# Patient Record
Sex: Female | Born: 1970 | Race: White | Hispanic: No | Marital: Married | Smoking: Never smoker
Health system: Southern US, Community
[De-identification: ages and names within clinical notes are randomized; demographics above are authoritative.]

## PROBLEM LIST (undated history)

## (undated) DIAGNOSIS — C4359 Malignant melanoma of other part of trunk: Secondary | ICD-10-CM

## (undated) DIAGNOSIS — F4329 Adjustment disorder with other symptoms: Secondary | ICD-10-CM

## (undated) DIAGNOSIS — D649 Anemia, unspecified: Secondary | ICD-10-CM

## (undated) HISTORY — DX: Anemia, unspecified: D64.9

## (undated) HISTORY — DX: Adjustment disorder with other symptoms: F43.29

## (undated) HISTORY — DX: Malignant melanoma of other part of trunk: C43.59

---

## 1998-02-20 ENCOUNTER — Inpatient Hospital Stay (HOSPITAL_COMMUNITY): Admission: AD | Admit: 1998-02-20 | Discharge: 1998-02-20 | Payer: Self-pay | Admitting: Obstetrics and Gynecology

## 1998-04-01 ENCOUNTER — Inpatient Hospital Stay (HOSPITAL_COMMUNITY): Admission: AD | Admit: 1998-04-01 | Discharge: 1998-04-04 | Payer: Self-pay | Admitting: Obstetrics and Gynecology

## 1998-05-07 ENCOUNTER — Other Ambulatory Visit: Admission: RE | Admit: 1998-05-07 | Discharge: 1998-05-07 | Payer: Self-pay | Admitting: Obstetrics and Gynecology

## 1999-05-09 ENCOUNTER — Other Ambulatory Visit: Admission: RE | Admit: 1999-05-09 | Discharge: 1999-05-09 | Payer: Self-pay | Admitting: Obstetrics and Gynecology

## 1999-12-30 ENCOUNTER — Other Ambulatory Visit: Admission: RE | Admit: 1999-12-30 | Discharge: 1999-12-30 | Payer: Self-pay | Admitting: Obstetrics and Gynecology

## 2000-05-30 ENCOUNTER — Encounter: Payer: Self-pay | Admitting: Obstetrics and Gynecology

## 2000-05-30 ENCOUNTER — Observation Stay (HOSPITAL_COMMUNITY): Admission: AD | Admit: 2000-05-30 | Discharge: 2000-05-31 | Payer: Self-pay | Admitting: Obstetrics and Gynecology

## 2000-06-02 ENCOUNTER — Inpatient Hospital Stay (HOSPITAL_COMMUNITY): Admission: AD | Admit: 2000-06-02 | Discharge: 2000-06-02 | Payer: Self-pay | Admitting: Obstetrics and Gynecology

## 2000-07-21 ENCOUNTER — Inpatient Hospital Stay (HOSPITAL_COMMUNITY): Admission: AD | Admit: 2000-07-21 | Discharge: 2000-07-24 | Payer: Self-pay | Admitting: Obstetrics and Gynecology

## 2000-10-18 ENCOUNTER — Encounter: Admission: RE | Admit: 2000-10-18 | Discharge: 2001-01-16 | Payer: Self-pay | Admitting: Obstetrics and Gynecology

## 2001-01-18 ENCOUNTER — Encounter: Admission: RE | Admit: 2001-01-18 | Discharge: 2001-04-12 | Payer: Self-pay | Admitting: Obstetrics and Gynecology

## 2001-04-17 ENCOUNTER — Encounter: Admission: RE | Admit: 2001-04-17 | Discharge: 2001-05-17 | Payer: Self-pay | Admitting: Obstetrics and Gynecology

## 2001-05-18 ENCOUNTER — Encounter: Admission: RE | Admit: 2001-05-18 | Discharge: 2001-06-17 | Payer: Self-pay | Admitting: Obstetrics and Gynecology

## 2001-09-08 ENCOUNTER — Other Ambulatory Visit: Admission: RE | Admit: 2001-09-08 | Discharge: 2001-09-08 | Payer: Self-pay | Admitting: Obstetrics and Gynecology

## 2002-04-18 ENCOUNTER — Other Ambulatory Visit: Admission: RE | Admit: 2002-04-18 | Discharge: 2002-04-18 | Payer: Self-pay | Admitting: Obstetrics and Gynecology

## 2002-10-24 ENCOUNTER — Inpatient Hospital Stay (HOSPITAL_COMMUNITY): Admission: AD | Admit: 2002-10-24 | Discharge: 2002-10-24 | Payer: Self-pay | Admitting: Obstetrics and Gynecology

## 2002-11-08 ENCOUNTER — Inpatient Hospital Stay (HOSPITAL_COMMUNITY): Admission: AD | Admit: 2002-11-08 | Discharge: 2002-11-11 | Payer: Self-pay | Admitting: Obstetrics and Gynecology

## 2005-12-14 HISTORY — PX: AUGMENTATION MAMMAPLASTY: SUR837

## 2011-08-11 ENCOUNTER — Ambulatory Visit: Payer: Self-pay | Admitting: Family Medicine

## 2016-09-30 ENCOUNTER — Other Ambulatory Visit: Payer: Self-pay | Admitting: Obstetrics and Gynecology

## 2016-09-30 DIAGNOSIS — Z1231 Encounter for screening mammogram for malignant neoplasm of breast: Secondary | ICD-10-CM

## 2016-10-06 ENCOUNTER — Ambulatory Visit
Admission: RE | Admit: 2016-10-06 | Discharge: 2016-10-06 | Disposition: A | Discharge status: Home / Self Care | Payer: Self-pay | Source: Ambulatory Visit | Attending: Obstetrics and Gynecology | Admitting: Obstetrics and Gynecology

## 2016-10-06 ENCOUNTER — Other Ambulatory Visit: Payer: Self-pay | Admitting: Obstetrics and Gynecology

## 2016-10-06 DIAGNOSIS — Z1231 Encounter for screening mammogram for malignant neoplasm of breast: Secondary | ICD-10-CM

## 2016-10-06 DIAGNOSIS — R928 Other abnormal and inconclusive findings on diagnostic imaging of breast: Secondary | ICD-10-CM | POA: Insufficient documentation

## 2016-10-08 ENCOUNTER — Other Ambulatory Visit: Payer: Self-pay | Admitting: Obstetrics and Gynecology

## 2016-10-09 ENCOUNTER — Other Ambulatory Visit: Payer: Self-pay | Admitting: Obstetrics and Gynecology

## 2016-10-09 DIAGNOSIS — R928 Other abnormal and inconclusive findings on diagnostic imaging of breast: Secondary | ICD-10-CM

## 2016-12-22 DIAGNOSIS — C4359 Malignant melanoma of other part of trunk: Secondary | ICD-10-CM | POA: Insufficient documentation

## 2016-12-22 HISTORY — DX: Malignant melanoma of other part of trunk: C43.59

## 2017-04-07 ENCOUNTER — Other Ambulatory Visit: Payer: Self-pay | Admitting: Obstetrics and Gynecology

## 2017-04-07 DIAGNOSIS — N6320 Unspecified lump in the left breast, unspecified quadrant: Secondary | ICD-10-CM

## 2017-04-26 ENCOUNTER — Ambulatory Visit
Admission: RE | Admit: 2017-04-26 | Discharge: 2017-04-26 | Disposition: A | Discharge status: Home / Self Care | Payer: Self-pay | Source: Ambulatory Visit | Attending: Oncology | Admitting: Oncology

## 2017-04-26 ENCOUNTER — Ambulatory Visit: Payer: Self-pay | Attending: Oncology

## 2017-04-26 VITALS — BP 123/74 | HR 73 | Temp 98.0°F | Ht 66.0 in | Wt 149.0 lb

## 2017-04-26 DIAGNOSIS — N63 Unspecified lump in unspecified breast: Secondary | ICD-10-CM

## 2017-04-26 NOTE — Progress Notes (Signed)
Subjective:     Patient ID: Dellis Filbert, female   DOB: Jun 01, 1971, 46 y.o.   MRN: [id number]  HPI   Review of Systems     Objective:   Physical Exam  Pulmonary/Chest: Right breast exhibits no inverted nipple, no mass, no nipple discharge, no skin change and no tenderness. Left breast exhibits no inverted nipple, no mass, no nipple discharge, no skin change and no tenderness. Breasts are symmetrical.    retroareolar glandular tissue       Assessment:     46 year old patient presents for [place] clinic visit.  Patient screened, and meets BCCCP eligibility.  Patient does not have insurance, Medicare or Medicaid.  Handout given on Affordable Care Act.  Instructed patient on breast self-exam using teach back method. She had a Birads 0 mammogram in October 2017, but did not  Return for additional views.  States in April, just prior to her menstrual cycle she felt lumps underneath her left areola.  They went away, but it scared her. States her provider said it felt like fibroglandular tissue.  On CBE, palpated bilateral retroareolar fibroglandular tissue.  Patient has bilateral breast implants.    Plan:    Sent for [facility] left diagnostic mammogram with tomosynthesis, and left breast ultrasound.

## 2017-04-27 ENCOUNTER — Other Ambulatory Visit: Payer: Self-pay

## 2017-04-27 DIAGNOSIS — N63 Unspecified lump in unspecified breast: Secondary | ICD-10-CM

## 2017-04-28 ENCOUNTER — Other Ambulatory Visit: Payer: Self-pay

## 2017-04-28 DIAGNOSIS — N63 Unspecified lump in unspecified breast: Secondary | ICD-10-CM

## 2017-05-05 ENCOUNTER — Ambulatory Visit: Payer: Self-pay

## 2017-05-12 ENCOUNTER — Ambulatory Visit: Admission: RE | Admit: 2017-05-12 | Payer: Self-pay | Source: Ambulatory Visit

## 2017-05-12 ENCOUNTER — Ambulatory Visit: Payer: Self-pay | Attending: Oncology

## 2017-05-21 NOTE — Progress Notes (Signed)
Patient did not show up for breast biopsy.  Messages left x 2 on her phone.

## 2017-06-23 NOTE — Progress Notes (Signed)
Mailed certified letter requesting patient to call back to schedule breast biopsy.

## 2017-07-29 ENCOUNTER — Ambulatory Visit
Admission: RE | Admit: 2017-07-29 | Discharge: 2017-07-29 | Disposition: A | Discharge status: Home / Self Care | Payer: Self-pay | Source: Ambulatory Visit | Attending: Oncology | Admitting: Oncology

## 2017-07-29 ENCOUNTER — Other Ambulatory Visit: Payer: Self-pay

## 2017-07-29 ENCOUNTER — Other Ambulatory Visit: Payer: Self-pay | Admitting: *Deleted

## 2017-07-29 DIAGNOSIS — N63 Unspecified lump in unspecified breast: Secondary | ICD-10-CM

## 2017-07-29 HISTORY — PX: BREAST BIOPSY: SHX20

## 2017-07-30 LAB — SURGICAL PATHOLOGY

## 2017-09-01 ENCOUNTER — Other Ambulatory Visit: Payer: Self-pay

## 2017-09-01 DIAGNOSIS — N63 Unspecified lump in unspecified breast: Secondary | ICD-10-CM

## 2017-09-01 NOTE — Progress Notes (Unsigned)
Benign Biposy results of fibroadenoma; Scheduled for 6 month follow-up mammogram on 01/31/18 at 10:00 per radiologist instruction. Mailed appointment information to patient.  Copy to [facility].

## 2017-10-26 DIAGNOSIS — F4329 Adjustment disorder with other symptoms: Secondary | ICD-10-CM

## 2017-10-26 HISTORY — DX: Adjustment disorder with other symptoms: F43.29

## 2017-12-27 ENCOUNTER — Other Ambulatory Visit: Payer: Self-pay

## 2017-12-27 ENCOUNTER — Inpatient Hospital Stay
Admission: EM | Admit: 2017-12-27 | Discharge: 2017-12-30 | DRG: 744 | Disposition: A | Discharge status: Home / Self Care | Payer: BLUE CROSS/BLUE SHIELD | Attending: Obstetrics & Gynecology | Admitting: Obstetrics & Gynecology

## 2017-12-27 DIAGNOSIS — Z23 Encounter for immunization: Secondary | ICD-10-CM

## 2017-12-27 DIAGNOSIS — D696 Thrombocytopenia, unspecified: Secondary | ICD-10-CM | POA: Diagnosis present

## 2017-12-27 DIAGNOSIS — D649 Anemia, unspecified: Secondary | ICD-10-CM | POA: Diagnosis present

## 2017-12-27 DIAGNOSIS — N939 Abnormal uterine and vaginal bleeding, unspecified: Secondary | ICD-10-CM

## 2017-12-27 DIAGNOSIS — D62 Acute posthemorrhagic anemia: Secondary | ICD-10-CM | POA: Diagnosis present

## 2017-12-27 DIAGNOSIS — N920 Excessive and frequent menstruation with regular cycle: Secondary | ICD-10-CM | POA: Diagnosis not present

## 2017-12-27 HISTORY — DX: Anemia, unspecified: D64.9

## 2017-12-27 LAB — BASIC METABOLIC PANEL
Anion gap: 9 (ref 5–15)
BUN: 12 mg/dL (ref 6–20)
CALCIUM: 8.2 mg/dL — AB (ref 8.9–10.3)
CO2: 22 mmol/L (ref 22–32)
Chloride: 105 mmol/L (ref 101–111)
Creatinine, Ser: 0.76 mg/dL (ref 0.44–1.00)
GFR calc Af Amer: >60 mL/min (ref >60)
GLUCOSE: 142 mg/dL — AB (ref 65–99)
Potassium: 3.8 mmol/L (ref 3.5–5.1)
Sodium: 136 mmol/L (ref 135–145)

## 2017-12-27 LAB — CBC
HCT: 18.5 % — ABNORMAL LOW (ref 35.0–47.0)
Hemoglobin: 6.2 g/dL — ABNORMAL LOW (ref 12.0–16.0)
MCH: 30.3 pg (ref 26.0–34.0)
MCHC: 33.3 g/dL (ref 32.0–36.0)
MCV: 91.1 fL (ref 80.0–100.0)
PLATELETS: 311 10*3/uL (ref 150–440)
RBC: 2.03 MIL/uL — ABNORMAL LOW (ref 3.80–5.20)
RDW: 12.5 % (ref 11.5–14.5)
WBC: 12.3 10*3/uL — ABNORMAL HIGH (ref 3.6–11.0)

## 2017-12-27 LAB — HCG, QUANTITATIVE, PREGNANCY

## 2017-12-27 MED ORDER — ACETAMINOPHEN 325 MG PO TABS
650.0000 mg | ORAL_TABLET | Freq: Once | ORAL | Status: AC
  Administered 2017-12-28: 650 mg via ORAL
  Filled 2017-12-27: qty 2

## 2017-12-27 MED ORDER — SODIUM CHLORIDE 0.9 % IV SOLN
10.0000 mL/h | Freq: Once | INTRAVENOUS | Status: AC
  Administered 2017-12-27: 10 mL/h via INTRAVENOUS

## 2017-12-27 MED ORDER — SODIUM CHLORIDE 0.9 % IV SOLN
Freq: Once | INTRAVENOUS | Status: AC
  Administered 2017-12-28: 03:00:00 via INTRAVENOUS

## 2017-12-27 MED ORDER — ONDANSETRON HCL 4 MG/2ML IJ SOLN: 4.0000 mg | Freq: Four times a day (QID) | INTRAMUSCULAR | Status: DC | PRN

## 2017-12-27 MED ORDER — ZOLPIDEM TARTRATE 5 MG PO TABS: 5.0000 mg | ORAL_TABLET | Freq: Every evening | ORAL | Status: DC | PRN

## 2017-12-27 MED ORDER — IBUPROFEN 600 MG PO TABS: 600.0000 mg | ORAL_TABLET | Freq: Four times a day (QID) | ORAL | Status: DC | PRN

## 2017-12-27 MED ORDER — DIPHENHYDRAMINE HCL 25 MG PO CAPS
25.0000 mg | ORAL_CAPSULE | Freq: Once | ORAL | Status: AC
  Administered 2017-12-28: 25 mg via ORAL
  Filled 2017-12-27: qty 1

## 2017-12-27 MED ORDER — ONDANSETRON HCL 4 MG PO TABS: 4.0000 mg | ORAL_TABLET | Freq: Four times a day (QID) | ORAL | Status: DC | PRN

## 2017-12-27 MED ORDER — SODIUM CHLORIDE 0.9 % IV SOLN
INTRAVENOUS | Status: DC
  Administered 2017-12-27 – 2017-12-28 (×3): via INTRAVENOUS

## 2017-12-27 MED ORDER — SODIUM CHLORIDE 0.9 % IV BOLUS (SEPSIS)
1000.0000 mL | Freq: Once | INTRAVENOUS | Status: AC
  Administered 2017-12-27: 1000 mL via INTRAVENOUS

## 2017-12-27 MED ORDER — TRANEXAMIC ACID 1000 MG/10ML IV SOLN
1000.0000 mg | INTRAVENOUS | Status: AC
  Administered 2017-12-28: 1000 mg via INTRAVENOUS
  Filled 2017-12-27: qty 10

## 2017-12-27 MED ORDER — TRANEXAMIC ACID 650 MG PO TABS
1300.0000 mg | ORAL_TABLET | Freq: Three times a day (TID) | ORAL | Status: DC
  Administered 2017-12-28 – 2017-12-30 (×6): 1300 mg via ORAL
  Filled 2017-12-27 (×9): qty 2

## 2017-12-27 MED ORDER — ACETAMINOPHEN 650 MG RE SUPP: 650.0000 mg | Freq: Four times a day (QID) | RECTAL | Status: DC | PRN

## 2017-12-27 MED ORDER — ACETAMINOPHEN 325 MG PO TABS
650.0000 mg | ORAL_TABLET | Freq: Four times a day (QID) | ORAL | Status: DC | PRN
  Filled 2017-12-27: qty 2

## 2017-12-27 NOTE — ED Triage Notes (Signed)
Pt in by [facility] for heavy vag bleeding that started Saturday night. EMS gave approx 500 cc of fluid, pt stats she had 2 syncopal episodes today.

## 2017-12-27 NOTE — Progress Notes (Signed)
Patient ID: TIERANY APPLEBY, female   DOB: 1971/10/10, 47 y.o.   MRN: [id number]  Consult History and Physical   SERVICE: Gynecology [facility])   Patient Name: Rachel Hunter Patient MRN:   [id number]  CC: Pt had a hx of heavy VB starting 12/25/17 with heavy clots soaking a pad an hour, fatigue and dizziness. Pt worsened today and  Pt seen at [facility] today by M.Haviland, CNM and diagnosed with anemia of Hgb of 8 with menorrhagia. Pt was told to fu with worsening of S/S and to take Fe. Workup for menorrhagia being planned.   HPI: Rachel Hunter is a 47 y.o. [id number] female with heavy vag bleeding unresolved after seeing CNM today with syncope x 2 and vomiting. Pt proceeded here for evaluation. Pt feels some improvement after IV fluids.     Review of Systems: positives in bold GEN: No fevers, chills, weight changes, appetite changes, fatigue, night sweats HEENT: No  HA, vision changes, hearing loss, congestion, rhinorrhea, sinus pressure, dysphagia CV: No  CP, palpitations PULM:  No SOB, cough GI: No  abd pain, N/V/D/C, +cramping in lower pelvic area  GU: No dysuria, urgency, frequency MSK: No arthralgias, myalgias, back pain, swelling SKIN: No rashes, color changes, pallor NEURO: No numbness, weakness, tingling, seizures, dizziness, tremors PSYCH: No  depression, anxiety, behavioral problems, confusion  HEME/LYMPH: No easy bruising or bleeding ENDO: No  heat/cold intolerance  Past Obstetrical History: [id number]  Past Gynecologic History: Patient's last menstrual period was 12/13/2017. Menstrual frequency Q 12  wks lasting 7 days requiring but had forgotten to take her Birth control after a UTI. Then her period began.   Past Medical History: Anemia, Heavy periods Past Surgical History:   Past Surgical History:  Procedure Laterality Date  . AUGMENTATION MAMMAPLASTY Bilateral 2007  . BREAST BIOPSY Left 07/29/2017   Path pending    Family History:  family history includes Breast cancer (age  of onset: 37) in her paternal grandmother.  Social History:  Social History   Socioeconomic History  . Marital status: Married    Spouse name: Not on file  . Number of children: Not on file  . Years of education: Not on file  . Highest education level: Not on file  Social Needs  . Financial resource strain: Not on file  . Food insecurity - worry: Not on file  . Food insecurity - inability: Not on file  . Transportation needs - medical: Not on file  . Transportation needs - non-medical: Not on file  Occupational History  . Not on file  Tobacco Use  . Smoking status: Not on file  Substance and Sexual Activity  . Alcohol use: Not on file  . Drug use: Not on file  . Sexual activity: Not on file  Other Topics Concern  . Not on file  Social History Narrative  . Not on file    Home Medications:  Medications reconciled in EPIC  No current facility-administered medications on file prior to encounter.    Current Outpatient Medications on File Prior to Encounter  Medication Sig Dispense Refill  . acidophilus (RISAQUAD) CAPS capsule Take 1 capsule by mouth daily.    . cetirizine (ZYRTEC ALLERGY) 10 MG tablet Take 10 mg by mouth daily.    . Cholecalciferol (VITAMIN D) 2000 units tablet Take 2,000 Units by mouth daily.    . DOCOSAHEXAENOIC ACID PO Take 1 capsule by mouth daily.    . norgestimate-ethinyl estradiol (ORTHO-CYCLEN,SPRINTEC,PREVIFEM) 0.25-35 MG-MCG tablet Take 1 tablet  by mouth daily. Take 1 tablet by mouth once daily. Take 3 weeks of pack and restart new pack. On the 3rd month, take the last week to have a period.    Marland Kitchen ascorbic acid (VITAMIN C) 500 MG tablet Take 500 mg by mouth daily.    . Calcium Carbonate 500 MG CHEW Chew 1,000 mg by mouth daily.      Allergies:  Allergies  Allergen Reactions  . Niacin And Related Shortness Of Breath    Physical Exam:  Pulse Rate:  [112-129] 112 (01/14 2130) Resp:  [17-26] 17 (01/14 2130) BP: (118)/(62) 118/62 (01/14  2130) SpO2:  [100 %] 100 % (01/14 2130) Weight:  [65.8 kg (145 lb)] 65.8 kg (145 lb) (01/14 2106)   General Appearance:  Well developed, well nourished, no acute distress, alert and oriented x3 HEENT:  Normocephalic atraumatic, extraocular movements intact, moist mucous membranes Cardiovascular:  Normal S1/S2, regular rate and rhythm, no murmurs Pulmonary:  clear to auscultation, no wheezes, rales or rhonchi, symmetric air entry, good air exchange Abdomen:  Bowel sounds present, soft, nontender, nondistended, no abnormal masses, no epigastric pain Extremities:  Full range of motion, no pedal edema, 2+ distal pulses, no tenderness Skin:  normal coloration and turgor, no rashes, no suspicious skin lesions noted  Neurologic:  Cranial nerves 2-12 grossly intact, normal muscle tone, strength 5/5 all four extremities Psychiatric:  Normal mood and affect, appropriate, no AH/VH Pelvic:  Deferred pending [place].    Labs/Studies:   CBC and Coags:  Lab Results  Component Value Date   WBC 12.3 (H) 12/27/2017   HGB 6.2 (L) 12/27/2017   HCT 18.5 (L) 12/27/2017   MCV 91.1 12/27/2017   PLT 311 12/27/2017   CMP:  Lab Results  Component Value Date   NA 136 12/27/2017   K 3.8 12/27/2017   CL 105 12/27/2017   CO2 22 12/27/2017   BUN 12 12/27/2017   CREATININE 0.76 12/27/2017   Other Labs:H&H 2 hrs post 2nd transfusion   TVUS: pending  Other Imaging: No results found.   Assessment / Plan:   NYKIAH MA is a 47 y.o. No obstetric history on file. who presents with syncope and anemia due to menorrhagia.   1. Lysteda 1 gm IV  Given for bleeding control.  2. Anemia: Replace with 2 units pc  3. [place] to rule out pathology  4. Pt may be a candidate for Novasure, hysterectomy or continue with [place] OCP's. 5 Continue Fe replacement.  Thank you for the opportunity to be involved with this pt's care.  Catheryn Bacon, RN, MSN, CNM, FNP

## 2017-12-27 NOTE — ED Provider Notes (Signed)
[facility] Emergency Department Provider Note  ____________________________________________  Time seen: Approximately 9:19 PM  I have reviewed the triage vital signs and the nursing notes.   HISTORY  Chief Complaint Vaginal Bleeding   HPI Rachel Hunter is a 47 y.o. female the history of anemia who presents for evaluation of syncope and vaginal bleeding. Patient reports 2 days of very heavy vaginal bleeding and passing large clots. This afternoon she had 2 syncopal episodes one while sitting in the toilet and the other one while ambulating back to her bedroom. She denies sustaining any injuries. Both times she woke up on the floor and had vomited. She is also endorses mild suprapubic abdominal cramping that has been present for the last few days. Patient reports that she is on a 3 month on one week off birth control regimen to avoid having monthly periods due to her history of anemia. She reports that her last menstrual period was December 27. She has not restarted the pills this time. She went to her PCP earlier today and was found to have a hemoglobin of 8.  No past medical history on file.  Patient Active Problem List   Diagnosis Date Noted  . Anemia 12/27/2017    Past Surgical History:  Procedure Laterality Date  . AUGMENTATION MAMMAPLASTY Bilateral 2007  . BREAST BIOPSY Left 07/29/2017   Path pending    Prior to Admission medications   Medication Sig Start Date End Date Taking? Authorizing Provider  acidophilus (RISAQUAD) CAPS capsule Take 1 capsule by mouth daily.   Yes [provider]  cetirizine (ZYRTEC ALLERGY) 10 MG tablet Take 10 mg by mouth daily.   Yes [provider]  Cholecalciferol (VITAMIN D) 2000 units tablet Take 2,000 Units by mouth daily.   Yes [provider]  DOCOSAHEXAENOIC ACID PO Take 1 capsule by mouth daily.   Yes [provider]  norgestimate-ethinyl estradiol  (ORTHO-CYCLEN,SPRINTEC,PREVIFEM) 0.25-35 MG-MCG tablet Take 1 tablet by mouth daily. Take 1 tablet by mouth once daily. Take 3 weeks of pack and restart new pack. On the 3rd month, take the last week to have a period. 09/29/16  Yes [provider]  ascorbic acid (VITAMIN C) 500 MG tablet Take 500 mg by mouth daily.    [provider]  Calcium Carbonate 500 MG CHEW Chew 1,000 mg by mouth daily.    [provider]    Allergies Niacin and related  Family History  Problem Relation Age of Onset  . Breast cancer Paternal Grandmother 60    Social History Social History   Tobacco Use  . Smoking status: Not on file  Substance Use Topics  . Alcohol use: Not on file  . Drug use: Not on file    Review of Systems  Constitutional: Negative for fever. + syncope Eyes: Negative for visual changes. ENT: Negative for sore throat. Neck: No neck pain  Cardiovascular: Negative for chest pain. Respiratory: Negative for shortness of breath. Gastrointestinal: Negative for abdominal pain,  Diarrhea. + vomiting Genitourinary: Negative for dysuria. + vaginal bleeding Musculoskeletal: Negative for back pain. Skin: Negative for rash. Neurological: Negative for headaches, weakness or numbness. Psych: No SI or HI  ____________________________________________   PHYSICAL EXAM:  VITAL SIGNS: Vitals:   12/27/17 2230 12/27/17 2345  BP: 107/73 110/65  Pulse: (!) 115 (!) 107  Resp: 14 15  Temp:  98.7 F (37.1 C)  SpO2: 100%     Constitutional: Alert and oriented. Well appearing and in  no apparent distress. HEENT:      Head: Normocephalic and atraumatic.         Eyes: Conjunctivae are normal. Sclera is non-icteric.       Mouth/Throat: Mucous membranes are moist.       Neck: Supple with no signs of meningismus. Cardiovascular: Tachycardic with regular rhythm. No murmurs, gallops, or rubs. 2+ symmetrical distal pulses are present in all extremities. No JVD. Respiratory:  Normal respiratory effort. Lungs are clear to auscultation bilaterally. No wheezes, crackles, or rhonchi.  Gastrointestinal: Soft, non tender, and non distended with positive bowel sounds. No rebound or guarding. Musculoskeletal: Nontender with normal range of motion in all extremities. No edema, cyanosis, or erythema of extremities. Neurologic: Normal speech and language. Face is symmetric. Moving all extremities. No gross focal neurologic deficits are appreciated. Skin: Skin is warm, dry and intact. No rash noted. Psychiatric: Mood and affect are normal. Speech and behavior are normal.  ____________________________________________   LABS (all labs ordered are listed, but only abnormal results are displayed)  Labs Reviewed  CBC - Abnormal; Notable for the following components:      Result Value   WBC 12.3 (*)    RBC 2.03 (*)    Hemoglobin 6.2 (*)    HCT 18.5 (*)    All other components within normal limits  BASIC METABOLIC PANEL - Abnormal; Notable for the following components:   Glucose, Bld 142 (*)    Calcium 8.2 (*)    All other components within normal limits  HCG, QUANTITATIVE, PREGNANCY  HIV ANTIBODY (ROUTINE TESTING)  TYPE AND SCREEN  PREPARE RBC (CROSSMATCH)  TYPE AND SCREEN  PREPARE RBC (CROSSMATCH)  ABO/RH   ____________________________________________  EKG  ED ECG REPORT I, Rudene Re, the attending physician, personally viewed and interpreted this ECG.  Sinus tachycardia, rate of 1:15, normal intervals, normal axis, no ST elevations or depressions  ____________________________________________  RADIOLOGY  none  ____________________________________________   PROCEDURES  Procedure(s) performed: None Procedures Critical Care performed: yes  CRITICAL CARE Performed by: Rudene Re  ?  Total critical care time: 40 min  Critical care time was exclusive of separately billable procedures and treating other patients.  Critical care was  necessary to treat or prevent imminent or life-threatening deterioration.  Critical care was time spent personally by me on the following activities: development of treatment plan with patient and/or surrogate as well as nursing, discussions with consultants, evaluation of patient's response to treatment, examination of patient, obtaining history from patient or surrogate, ordering and performing treatments and interventions, ordering and review of laboratory studies, ordering and review of radiographic studies, pulse oximetry and re-evaluation of patient's condition.  ____________________________________________   INITIAL IMPRESSION / ASSESSMENT AND PLAN / ED COURSE  47 y.o. female the history of anemia who presents for evaluation of syncope in the setting of 2 days of heavy vaginal bleeding. Ddx hormonal changes since patient has been on OCP for several years and has not restarted her new prescription vs miscarriage vs heavy menstrual period. Will give IVF, type and screen, check CBC, BMP, hCG.  Clinical Course as of Dec 27 2350  Mon Dec 27, 2017  2237 Hemoglobin is 6.2. We'll give her a unit of emergency release blood. HCG is pending. Anticipate admission.   [CV]    Clinical Course User Index [CV] Alfred Levins [place], MD   _________________________ 11:52 PM on 12/27/2017 -----------------------------------------  HCG negative. Patient receiving emergent unit of blood. Admitted to OB/GYN.  As part of my medical decision  making, I reviewed the following data within the [facility] notes reviewed and incorporated, Labs reviewed , Old chart reviewed, Discussed with admitting physician , Notes from prior ED visits and  Controlled Substance Database    Pertinent labs & imaging results that were available during my care of the patient were reviewed by me and considered in my medical decision making (see chart for  details).    ____________________________________________   FINAL CLINICAL IMPRESSION(S) / ED DIAGNOSES  Final diagnoses:  Vaginal bleeding  Severe anemia      NEW MEDICATIONS STARTED DURING THIS VISIT:  ED Discharge Orders    None       Note:  This document was prepared using Dragon voice recognition software and may include unintentional dictation errors.    Rudene Re, MD 12/27/17 [phone]

## 2017-12-27 NOTE — ED Notes (Addendum)
Blood Transfusion #1 Emergency release started [id number] checked with 2 RNs  infusing to R #20 ac

## 2017-12-27 NOTE — ED Notes (Signed)
Pretransfusion VS 98.4 107/73 110-18 99%

## 2017-12-28 ENCOUNTER — Inpatient Hospital Stay: Payer: BLUE CROSS/BLUE SHIELD | Admitting: Anesthesiology

## 2017-12-28 ENCOUNTER — Encounter
Admission: EM | Disposition: A | Discharge status: Home / Self Care | Payer: Self-pay | Source: Home / Self Care | Attending: Obstetrics & Gynecology

## 2017-12-28 ENCOUNTER — Observation Stay: Payer: BLUE CROSS/BLUE SHIELD

## 2017-12-28 DIAGNOSIS — N920 Excessive and frequent menstruation with regular cycle: Secondary | ICD-10-CM | POA: Diagnosis present

## 2017-12-28 DIAGNOSIS — D62 Acute posthemorrhagic anemia: Secondary | ICD-10-CM | POA: Diagnosis present

## 2017-12-28 DIAGNOSIS — D696 Thrombocytopenia, unspecified: Secondary | ICD-10-CM | POA: Diagnosis present

## 2017-12-28 DIAGNOSIS — Z23 Encounter for immunization: Secondary | ICD-10-CM | POA: Diagnosis not present

## 2017-12-28 HISTORY — PX: DILATION AND EVACUATION: SHX1459

## 2017-12-28 LAB — CBC
HCT: 24.4 % — ABNORMAL LOW (ref 35.0–47.0)
HEMATOCRIT: 17.2 % — AB (ref 35.0–47.0)
HEMOGLOBIN: 6 g/dL — AB (ref 12.0–16.0)
Hemoglobin: 8.4 g/dL — ABNORMAL LOW (ref 12.0–16.0)
MCH: 30.5 pg (ref 26.0–34.0)
MCH: 30.1 pg (ref 26.0–34.0)
MCHC: 34.9 g/dL (ref 32.0–36.0)
MCHC: 34.3 g/dL (ref 32.0–36.0)
MCV: 87.5 fL (ref 80.0–100.0)
MCV: 87.8 fL (ref 80.0–100.0)
PLATELETS: 199 10*3/uL (ref 150–440)
Platelets: 200 10*3/uL (ref 150–440)
RBC: 1.96 MIL/uL — AB (ref 3.80–5.20)
RBC: 2.78 MIL/uL — AB (ref 3.80–5.20)
RDW: 13.8 % (ref 11.5–14.5)
RDW: 14.5 % (ref 11.5–14.5)
WBC: 11.6 10*3/uL — ABNORMAL HIGH (ref 3.6–11.0)
WBC: 18.2 10*3/uL — AB (ref 3.6–11.0)

## 2017-12-28 LAB — HEMOGLOBIN AND HEMATOCRIT, BLOOD
HEMATOCRIT: 22.8 % — AB (ref 35.0–47.0)
HEMOGLOBIN: 8 g/dL — AB (ref 12.0–16.0)

## 2017-12-28 LAB — RAPID HIV SCREEN (HIV 1/2 AB+AG)
HIV 1/2 Antibodies: NONREACTIVE
HIV-1 P24 Antigen - HIV24: NONREACTIVE

## 2017-12-28 LAB — PREPARE RBC (CROSSMATCH)

## 2017-12-28 LAB — ABO/RH: ABO/RH(D): A POS

## 2017-12-28 SURGERY — DILATION AND EVACUATION, UTERUS
Anesthesia: General | Site: Uterus | Wound class: Clean Contaminated

## 2017-12-28 MED ORDER — INFLUENZA VAC SPLIT QUAD 0.5 ML IM SUSY
0.5000 mL | PREFILLED_SYRINGE | INTRAMUSCULAR | Status: AC
  Administered 2017-12-29: 0.5 mL via INTRAMUSCULAR
  Filled 2017-12-28: qty 0.5

## 2017-12-28 MED ORDER — ONDANSETRON HCL 4 MG/2ML IJ SOLN
4.0000 mg | Freq: Once | INTRAMUSCULAR | Status: AC
  Administered 2017-12-28: 4 mg via INTRAVENOUS
  Filled 2017-12-28: qty 2

## 2017-12-28 MED ORDER — ONDANSETRON HCL 4 MG/2ML IJ SOLN
INTRAMUSCULAR | Status: AC
  Filled 2017-12-28: qty 2

## 2017-12-28 MED ORDER — SILVER NITRATE-POT NITRATE 75-25 % EX MISC
CUTANEOUS | Status: DC | PRN
  Administered 2017-12-28: 1

## 2017-12-28 MED ORDER — DEXAMETHASONE SODIUM PHOSPHATE 10 MG/ML IJ SOLN
INTRAMUSCULAR | Status: AC
  Filled 2017-12-28: qty 1

## 2017-12-28 MED ORDER — SODIUM CHLORIDE 0.9 % IV SOLN
Freq: Once | INTRAVENOUS | Status: AC
Start: 2017-12-28 — End: 2017-12-28
  Administered 2017-12-28: 16:00:00 via INTRAVENOUS

## 2017-12-28 MED ORDER — ESTROGENS CONJUGATED 25 MG IJ SOLR
25.0000 mg | Freq: Once | INTRAMUSCULAR | Status: AC
  Administered 2017-12-28: 25 mg via INTRAVENOUS
  Filled 2017-12-28: qty 25

## 2017-12-28 MED ORDER — SODIUM CHLORIDE 0.9 % IV SOLN: Freq: Once | INTRAVENOUS | Status: DC

## 2017-12-28 MED ORDER — NORETHINDRONE-ETH ESTRADIOL 0.5-35 MG-MCG PO TABS
1.0000 | ORAL_TABLET | Freq: Four times a day (QID) | ORAL | Status: DC
  Administered 2017-12-28: 1 via ORAL
  Filled 2017-12-28 (×2): qty 1

## 2017-12-28 MED ORDER — MIDAZOLAM HCL 2 MG/2ML IJ SOLN
INTRAMUSCULAR | Status: DC | PRN
  Administered 2017-12-28: 2 mg via INTRAVENOUS

## 2017-12-28 MED ORDER — LACTATED RINGERS IV SOLN
INTRAVENOUS | Status: DC | PRN
  Administered 2017-12-28: 21:00:00 via INTRAVENOUS

## 2017-12-28 MED ORDER — ROCURONIUM BROMIDE 50 MG/5ML IV SOLN
INTRAVENOUS | Status: AC
  Filled 2017-12-28: qty 1

## 2017-12-28 MED ORDER — DIPHENHYDRAMINE HCL 25 MG PO CAPS
25.0000 mg | ORAL_CAPSULE | Freq: Once | ORAL | Status: AC
  Administered 2017-12-28: 25 mg via ORAL
  Filled 2017-12-28: qty 1

## 2017-12-28 MED ORDER — ROCURONIUM BROMIDE 100 MG/10ML IV SOLN
INTRAVENOUS | Status: DC | PRN
  Administered 2017-12-28: 20 mg via INTRAVENOUS

## 2017-12-28 MED ORDER — FENTANYL CITRATE (PF) 100 MCG/2ML IJ SOLN
INTRAMUSCULAR | Status: DC | PRN
  Administered 2017-12-28 (×2): 50 ug via INTRAVENOUS

## 2017-12-28 MED ORDER — SILVER NITRATE-POT NITRATE 75-25 % EX MISC
CUTANEOUS | Status: AC
  Filled 2017-12-28: qty 1

## 2017-12-28 MED ORDER — FENTANYL CITRATE (PF) 100 MCG/2ML IJ SOLN
INTRAMUSCULAR | Status: AC
  Filled 2017-12-28: qty 2

## 2017-12-28 MED ORDER — PROPOFOL 10 MG/ML IV BOLUS
INTRAVENOUS | Status: DC | PRN
  Administered 2017-12-28: 150 mg via INTRAVENOUS

## 2017-12-28 MED ORDER — ONDANSETRON HCL 4 MG/2ML IJ SOLN
INTRAMUSCULAR | Status: DC | PRN
  Administered 2017-12-28: 4 mg via INTRAVENOUS

## 2017-12-28 MED ORDER — NORETHIN-ETH ESTRADIOL-FE 0.4-35 MG-MCG PO CHEW
1.0000 | CHEWABLE_TABLET | Freq: Four times a day (QID) | ORAL | Status: DC
  Filled 2017-12-28: qty 1

## 2017-12-28 MED ORDER — SUCCINYLCHOLINE CHLORIDE 20 MG/ML IJ SOLN
INTRAMUSCULAR | Status: AC
  Filled 2017-12-28: qty 1

## 2017-12-28 MED ORDER — SODIUM CHLORIDE 0.9 % IV SOLN: 50.0000 mL/h | INTRAVENOUS | Status: DC

## 2017-12-28 MED ORDER — LIDOCAINE HCL (CARDIAC) 20 MG/ML IV SOLN
INTRAVENOUS | Status: DC | PRN
  Administered 2017-12-28: 30 mg via INTRAVENOUS

## 2017-12-28 MED ORDER — ONDANSETRON HCL 4 MG/2ML IJ SOLN
4.0000 mg | Freq: Once | INTRAMUSCULAR | Status: AC | PRN
  Administered 2017-12-28: 4 mg via INTRAVENOUS

## 2017-12-28 MED ORDER — LIDOCAINE HCL (PF) 2 % IJ SOLN
INTRAMUSCULAR | Status: AC
  Filled 2017-12-28: qty 10

## 2017-12-28 MED ORDER — MIDAZOLAM HCL 2 MG/2ML IJ SOLN
INTRAMUSCULAR | Status: AC
  Filled 2017-12-28: qty 2

## 2017-12-28 MED ORDER — PROPOFOL 10 MG/ML IV BOLUS
INTRAVENOUS | Status: AC
  Filled 2017-12-28: qty 20

## 2017-12-28 MED ORDER — ACETAMINOPHEN 325 MG PO TABS
650.0000 mg | ORAL_TABLET | Freq: Once | ORAL | Status: AC
Start: 2017-12-28 — End: 2017-12-28
  Administered 2017-12-28: 650 mg via ORAL
  Filled 2017-12-28: qty 2

## 2017-12-28 MED ORDER — SUGAMMADEX SODIUM 500 MG/5ML IV SOLN
INTRAVENOUS | Status: DC | PRN
  Administered 2017-12-28: 150 mg via INTRAVENOUS

## 2017-12-28 MED ORDER — SODIUM CHLORIDE 0.9 % IV SOLN
INTRAVENOUS | Status: DC | PRN
  Administered 2017-12-28: 25 ug via INTRAVENOUS

## 2017-12-28 MED ORDER — CEFAZOLIN SODIUM-DEXTROSE 2-3 GM-%(50ML) IV SOLR
INTRAVENOUS | Status: DC | PRN
  Administered 2017-12-28: 2 g via INTRAVENOUS

## 2017-12-28 MED ORDER — FENTANYL CITRATE (PF) 100 MCG/2ML IJ SOLN
25.0000 ug | INTRAMUSCULAR | Status: DC | PRN
  Administered 2017-12-28 (×4): 25 ug via INTRAVENOUS

## 2017-12-28 MED ORDER — TRANEXAMIC ACID 650 MG PO TABS
1300.0000 mg | ORAL_TABLET | Freq: Three times a day (TID) | ORAL | Status: DC
  Filled 2017-12-28: qty 2

## 2017-12-28 MED ORDER — PHENYLEPHRINE HCL 10 MG/ML IJ SOLN
INTRAMUSCULAR | Status: DC | PRN
  Administered 2017-12-28 (×4): 100 ug via INTRAVENOUS

## 2017-12-28 MED ORDER — DEXAMETHASONE SODIUM PHOSPHATE 10 MG/ML IJ SOLN
INTRAMUSCULAR | Status: DC | PRN
  Administered 2017-12-28: 10 mg via INTRAVENOUS

## 2017-12-28 MED ORDER — PHENYLEPHRINE HCL 10 MG/ML IJ SOLN
INTRAMUSCULAR | Status: AC
  Filled 2017-12-28: qty 1

## 2017-12-28 SURGICAL SUPPLY — 71 items
BACTOSHIELD CHG 4% 4OZ (MISCELLANEOUS) ×1
BAG URINE DRAINAGE (UROLOGICAL SUPPLIES) IMPLANT
BLADE SURG SZ11 CARB STEEL (BLADE) ×3 IMPLANT
CANISTER SUC SOCK COL 7IN (MISCELLANEOUS) IMPLANT
CANISTER SUCT 1200ML W/VALVE (MISCELLANEOUS) IMPLANT
CATH FOL 2WAY LX 16X30 (CATHETERS) ×3 IMPLANT
CATH FOLEY 2WAY  5CC 16FR (CATHETERS)
CATH ROBINSON RED A/P 16FR (CATHETERS) ×3 IMPLANT
CATH URTH 16FR FL 2W BLN LF (CATHETERS) IMPLANT
CHLORAPREP W/TINT 26ML (MISCELLANEOUS) ×3 IMPLANT
DEFOGGER SCOPE WARMER CLEARIFY (MISCELLANEOUS) ×3 IMPLANT
DERMABOND ADVANCED (GAUZE/BANDAGES/DRESSINGS)
DERMABOND ADVANCED .7 DNX12 (GAUZE/BANDAGES/DRESSINGS) IMPLANT
DEVICE SUTURE ENDOST 10MM (ENDOMECHANICALS) IMPLANT
DRAPE LEGGINS SURG 28X43 STRL (DRAPES) ×3 IMPLANT
DRAPE SHEET LG 3/4 BI-LAMINATE (DRAPES) ×3 IMPLANT
DRAPE UNDER BUTTOCK W/FLU (DRAPES) ×3 IMPLANT
DRSG TELFA 3X8 NADH (GAUZE/BANDAGES/DRESSINGS) ×3 IMPLANT
ELECT REM PT RETURN 9FT ADLT (ELECTROSURGICAL) ×3
ELECTRODE REM PT RTRN 9FT ADLT (ELECTROSURGICAL) ×2 IMPLANT
FILTER UTR ASPR SPEC (MISCELLANEOUS) IMPLANT
FLTR UTR ASPR SPEC (MISCELLANEOUS)
GLOVE PI ORTHOPRO 6.5 (GLOVE) ×2
GLOVE PI ORTHOPRO STRL 6.5 (GLOVE) ×4 IMPLANT
GLOVE SURG SYN 6.5 ES PF (GLOVE) ×9 IMPLANT
GOWN STRL REUS W/ TWL LRG LVL3 (GOWN DISPOSABLE) ×4 IMPLANT
GOWN STRL REUS W/ TWL XL LVL3 (GOWN DISPOSABLE) IMPLANT
GOWN STRL REUS W/TWL LRG LVL3 (GOWN DISPOSABLE) ×2
GOWN STRL REUS W/TWL XL LVL3 (GOWN DISPOSABLE)
IRRIGATION STRYKERFLOW (MISCELLANEOUS) IMPLANT
IRRIGATOR STRYKERFLOW (MISCELLANEOUS)
IV LACTATED RINGERS 1000ML (IV SOLUTION) ×3 IMPLANT
KIT BERKELEY 1ST TRIMESTER 3/8 (MISCELLANEOUS) IMPLANT
KIT PINK PAD W/HEAD ARE REST (MISCELLANEOUS) ×3
KIT PINK PAD W/HEAD ARM REST (MISCELLANEOUS) ×2 IMPLANT
KIT RM TURNOVER CYSTO AR (KITS) ×3 IMPLANT
L-HOOK LAP DISP 36CM (ELECTROSURGICAL)
LHOOK LAP DISP 36CM (ELECTROSURGICAL) IMPLANT
LIGASURE VESSEL 5MM BLUNT TIP (ELECTROSURGICAL) IMPLANT
MANIPULATOR VCARE LG CRV RETR (MISCELLANEOUS) IMPLANT
MANIPULATOR VCARE SML CRV RETR (MISCELLANEOUS) IMPLANT
MANIPULATOR VCARE STD CRV RETR (MISCELLANEOUS) IMPLANT
NEEDLE SPNL 20GX3.5 QUINCKE YW (NEEDLE) IMPLANT
NS IRRIG 500ML POUR BTL (IV SOLUTION) ×3 IMPLANT
PACK DNC HYST (MISCELLANEOUS) IMPLANT
PACK LAP CHOLECYSTECTOMY (MISCELLANEOUS) ×3 IMPLANT
PAD OB MATERNITY 4.3X12.25 (PERSONAL CARE ITEMS) ×3 IMPLANT
PAD PREP 24X41 OB/GYN DISP (PERSONAL CARE ITEMS) ×3 IMPLANT
PENCIL ELECTRO HAND CTR (MISCELLANEOUS) IMPLANT
SCRUB CHG 4% DYNA-HEX 4OZ (MISCELLANEOUS) ×2 IMPLANT
SET BERKELEY SUCTION TUBING (SUCTIONS) IMPLANT
SET CYSTO W/LG BORE CLAMP LF (SET/KITS/TRAYS/PACK) ×3 IMPLANT
SET YANKAUER POOLE SUCT (MISCELLANEOUS) IMPLANT
SLEEVE ENDOPATH XCEL 5M (ENDOMECHANICALS) ×3 IMPLANT
SLEEVE SCD COMPRESS THIGH MED (MISCELLANEOUS) ×3 IMPLANT
SPONGE LAP 18X18 5 PK (GAUZE/BANDAGES/DRESSINGS) ×3 IMPLANT
SPONGE XRAY 4X4 16PLY STRL (MISCELLANEOUS) ×3 IMPLANT
SURGILUBE 2OZ TUBE FLIPTOP (MISCELLANEOUS) ×3 IMPLANT
SUT ENDO VLOC 180-0-8IN (SUTURE) IMPLANT
SUT MNCRL 4-0 (SUTURE)
SUT MNCRL 4-0 27XMFL (SUTURE)
SUT VIC AB 0 CT1 36 (SUTURE) ×3 IMPLANT
SUTURE MNCRL 4-0 27XMF (SUTURE) IMPLANT
SYR 10ML LL (SYRINGE) IMPLANT
SYR 30ML LL (SYRINGE) ×3 IMPLANT
TOWEL OR 17X26 4PK STRL BLUE (TOWEL DISPOSABLE) ×3 IMPLANT
TROCAR XCEL NON-BLD 5MMX100MML (ENDOMECHANICALS) IMPLANT
TUBING INSUF HEATED (TUBING) IMPLANT
VACURETTE 10 RIGID CVD (CANNULA) IMPLANT
VACURETTE 12 RIGID CVD (CANNULA) IMPLANT
VACURETTE 8 RIGID CVD (CANNULA) IMPLANT

## 2017-12-28 NOTE — Transfer of Care (Signed)
Immediate Anesthesia Transfer of Care Note  Patient: Rachel Hunter  Procedure(s) Performed: DILATATION and curretage, hysteroscopy, balloon tamponade in uterus (N/A Uterus)  Patient Location: PACU  Anesthesia Type:General  Level of Consciousness: awake, alert , oriented and patient cooperative  Airway & Oxygen Therapy: Patient Spontanous Breathing  Post-op Assessment: Report given to RN and Post -op Vital signs reviewed and stable  Post vital signs: Reviewed and stable  Last Vitals:  Vitals:   12/28/17 2305 12/28/17 2310  BP: 123/80   Pulse: (!) (P) 103 (!) 102  Resp: (!) 22 (!) 26  Temp: 36.4 C   SpO2: (P) 100% 100%    Last Pain:  Vitals:   12/28/17 1957  TempSrc: Oral         Complications: No apparent anesthesia complications

## 2017-12-28 NOTE — ED Notes (Signed)
Patient transported to Ultrasound 

## 2017-12-28 NOTE — Anesthesia Post-op Follow-up Note (Signed)
Anesthesia QCDR form completed.        

## 2017-12-28 NOTE — Anesthesia Preprocedure Evaluation (Signed)
Anesthesia Evaluation  Patient identified by MRN, date of birth, ID band Patient awake    Reviewed: Allergy & Precautions, NPO status , Patient's Chart, lab work & pertinent test results  History of Anesthesia Complications Negative for: history of anesthetic complications  Airway Mallampati: II       Dental   Pulmonary neg sleep apnea, neg COPD,           Cardiovascular (-) hypertension(-) Past MI (-) dysrhythmias (-) Valvular Problems/Murmurs     Neuro/Psych neg Seizures    GI/Hepatic Neg liver ROS, neg GERD  ,  Endo/Other  neg diabetes  Renal/GU negative Renal ROS     Musculoskeletal   Abdominal   Peds  Hematology  (+) anemia ,   Anesthesia Other Findings   Reproductive/Obstetrics                             Anesthesia Physical Anesthesia Plan  ASA: II and emergent  Anesthesia Plan: General   Post-op Pain Management:    Induction: Intravenous  PONV Risk Score and Plan: 3 and Dexamethasone, Ondansetron and Midazolam  Airway Management Planned: Oral ETT  Additional Equipment:   Intra-op Plan:   Post-operative Plan:   Informed Consent: I have reviewed the patients History and Physical, chart, labs and discussed the procedure including the risks, benefits and alternatives for the proposed anesthesia with the patient or authorized representative who has indicated his/her understanding and acceptance.     Plan Discussed with:   Anesthesia Plan Comments:         Anesthesia Quick Evaluation

## 2017-12-28 NOTE — Op Note (Signed)
Operative Report Hysteroscopy, Dilation and Curettage 12/28/2017  Patient:  ZONYA GUDGER  47 y.o. female Preoperative diagnosis:  menorrhagia, anemia Postoperative diagnosis:  same  PROCEDURE:  Procedure(s): DILATATION and curretage, hysteroscopy, balloon tamponade in uterus (N/A) Surgeon:  Surgeon(s) and Role:    * Ward, Honor Loh, MD - Primary Anesthesia:  GET I/O: Total I/O In: 2878 [I.V.:1100; Blood:220] Out: 100 [Blood:100] Specimens:  Endometrial curettings Complications: None Apparent Disposition:  VS stable to PACU  Findings: Uterus, mobile, normal size, sounding to 9cm; normal cervix, vagina, perineum.  Approximately 500cc clotted and bright red blood in the vagina prior to procedure. Hysteroscopic findings: fluffy endometrium, sessile polyp in the upper right quadrant of the endometrial cavity, left upper quadrant had a slight indentation/mass effect. Bilateral ostea visualized. With maximum and minimum pressure, there was no evidence of a specific site of bleeding  Indication for procedure/Consents: 47 y.o. who presented to the ED with heavy vaginal bleeding and syncopal episodes. Hemoglobin was 6.2.  She was given a total of 4u PRBC, IV and PO lysteda, IV and PO estrogen, and ultrasound showed a vascular mass in the uterus, uncertain if in the endometrium or myometrium.   Risks of surgery were discussed with the patient including but not limited to: bleeding which may require transfusion; infection which may require antibiotics; injury to uterus or surrounding organs; intrauterine scarring which may impair future fertility; need for additional procedures including laparotomy or laparoscopy and hysterectomy; and other postoperative/anesthesia complications. Written informed consent was obtained.    Procedure Details:   The patient was then taken to the operating room where anesthesia was administered and was found to be adequate.  After a formal timeout was performed, she was  placed in the dorsal lithotomy position and examined with the above findings. She was then prepped and draped in the sterile manner.  A speculum was then placed in the patient's vagina, with evacuation of copious blood and clots.  A single tooth tenaculum was applied to the anterior lip of the cervix.   The uterus was sounded to 9cm. Her cervix was serially dilated to accommodate the hysteroscope, with findings as above. A sharp curettage was then performed until there was a gritty texture in all four quadrants. The camera was reinserted and confirmed the uterus had been evacuated.  The specimen was handed off to nursing. The cervix was observed for 5 minutes with minimal, but still some bleeding from the cervical os.  Stay sutures were placed to ligate the cervical branches at 3 and 9 o'clock.  After 5 minutes there was still minimal bleeding.  A foley catheter was placed in the uterus and balloon inflated to 12cc with saline for tamponade. The tenaculum was removed from the anterior lip of the cervix and the vaginal speculum was removed after noting good hemostasis. The patient tolerated the procedure well and was taken to the recovery area awake, extubated and in stable condition.  Ancef was given in the OR due to the balloon tamponade placed.  She was brought to the PACU in a stable condition. CBC and Coagulation panel was drawn in the PACU.  Larey Days, MD [facility] OBGYN Attending Gynecologist

## 2017-12-28 NOTE — Progress Notes (Signed)
Second unit of PRBCs started at this time. First unit given in ED (as stated by Danise Mina, RN) and completed prior to patient arriving to unit.

## 2017-12-28 NOTE — Anesthesia Procedure Notes (Signed)
Procedure Name: Intubation Date/Time: 12/28/2017 9:33 PM Performed by: Lendon Colonel, CRNA Pre-anesthesia Checklist: Patient identified, Patient being monitored, Timeout performed, Emergency Drugs available and Suction available Patient Re-evaluated:Patient Re-evaluated prior to induction Oxygen Delivery Method: Circle system utilized Preoxygenation: Pre-oxygenation with 100% oxygen Induction Type: IV induction Ventilation: Mask ventilation without difficulty Laryngoscope Size: Mac and 3 Grade View: Grade I Tube type: Oral Tube size: 7.0 mm Number of attempts: 1 Airway Equipment and Method: Stylet Placement Confirmation: ETT inserted through vocal cords under direct vision,  positive ETCO2 and breath sounds checked- equal and bilateral Secured at: 21 cm Tube secured with: Tape Dental Injury: Teeth and Oropharynx as per pre-operative assessment

## 2017-12-28 NOTE — Progress Notes (Signed)
Notified by RN that patient had two large clots pass upon using the bathroom, and the pad was markedly saturated for the first time today. Patient is dizzy upon standing.  BP 103/69   Pulse (!) 102   Temp 98.5 F (36.9 C) (Oral)   Resp 16   Ht 5\' 6"  (1.676 m)   Wt 65.8 kg (145 lb)   LMP 12/13/2017   SpO2 99%   BMI 23.40 kg/m   Will order IV estrogen, IV zofran, make patient NPO and consider D&C if bleeding does not improve in the next 4-5 hours. CBC ordered and if hemoglobin has dropped will order another unit of blood.  ----- Larey Days, MD Attending Obstetrician and Gynecologist [facility], Department of [facility]

## 2017-12-28 NOTE — ED Notes (Signed)
Call to give report to 3rd floor. Floor RN will call back

## 2017-12-28 NOTE — H&P (Signed)
Patient ID: Rachel Hunter, female   DOB: 05-Apr-1971, 47 y.o.   MRN: [id number]  Consult History and Physical   SERVICE: Gynecology [facility])   Patient Name: Rachel Hunter Patient MRN:   [id number]  CC: Pt had a hx of heavy VB starting 12/25/17 with heavy clots soaking a pad an hour, fatigue and dizziness. Pt worsened today and  Pt seen at [facility] today by M.Haviland, CNM and diagnosed with anemia of Hgb of 8 with menorrhagia. Pt was told to fu with worsening of S/S and to take Fe. Workup for menorrhagia being planned.   HPI: Rachel Hunter is a 47 y.o. [id number] female with heavy vag bleeding unresolved after seeing CNM today with syncope x 2 and vomiting. Pt proceeded here for evaluation. Pt feels some improvement after IV fluids.    Patient was on q12wk OCPs for reduced cycles.  She has never had bleeding like this before, had stopped her OCP due to abx use but restarted it 1 week ago with no bleeding.  This episode of bleeding was sudden and surprisingly heavy, bleeding down her leg.  No known bleeding disorders.   Review of Systems: positives in bold GEN: No fevers, chills, weight changes, appetite changes, fatigue, night sweats HEENT: No  HA, vision changes, hearing loss, congestion, rhinorrhea, sinus pressure, dysphagia CV: No  CP, palpitations PULM:  No SOB, cough GI: No  abd pain, N/V/D/C, +cramping in lower pelvic area  GU: No dysuria, urgency, frequency MSK: No arthralgias, myalgias, back pain, swelling SKIN: No rashes, color changes, pallor NEURO: No numbness, weakness, tingling, seizures, dizziness, tremors PSYCH: No  depression, anxiety, behavioral problems, confusion  HEME/LYMPH: No easy bruising or bleeding ENDO: No  heat/cold intolerance  Past Obstetrical History: [id number]  Past Gynecologic History: Patient's last menstrual period was 12/13/2017. Menstrual frequency Q 12  wks with menses lasting 7 days. Takes q28mo pills to help with her heavy bleeding and anemia.   3 c/s, first  emergent, no hemorrhages or blood transfusions needed  Past Medical History: Anemia, Heavy periods  Past Surgical History:   Past Surgical History:  Procedure Laterality Date  . AUGMENTATION MAMMAPLASTY Bilateral 2007  . BREAST BIOPSY Left 07/29/2017   Path pending    Family History:  family history includes Breast cancer (age of onset: 36) in her paternal grandmother.  Social History:  Social History   Socioeconomic History  . Marital status: Married    Spouse name: Not on file  . Number of children: Not on file  . Years of education: Not on file  . Highest education level: Not on file  Social Needs  . Financial resource strain: Not on file  . Food insecurity - worry: Not on file  . Food insecurity - inability: Not on file  . Transportation needs - medical: Not on file  . Transportation needs - non-medical: Not on file  Occupational History  . Not on file  Tobacco Use  . Smoking status: Never Smoker  . Smokeless tobacco: Never Used  Substance and Sexual Activity  . Alcohol use: No    Frequency: Never  . Drug use: No  . Sexual activity: Yes    Birth control/protection: Pill  Other Topics Concern  . Not on file  Social History Narrative  . Not on file    Home Medications:  Medications reconciled in EPIC  No current facility-administered medications on file prior to encounter.    Current Outpatient Medications on File Prior to Encounter  Medication  Sig Dispense Refill  . acidophilus (RISAQUAD) CAPS capsule Take 1 capsule by mouth daily.    Marland Kitchen ascorbic acid (VITAMIN C) 500 MG tablet Take 500 mg by mouth daily.    . Calcium Carbonate 500 MG CHEW Chew 1,000 mg by mouth daily.    . cetirizine (ZYRTEC ALLERGY) 10 MG tablet Take 10 mg by mouth daily.    . Cholecalciferol (VITAMIN D) 2000 units tablet Take 2,000 Units by mouth daily.    . DOCOSAHEXAENOIC ACID PO Take 1 capsule by mouth daily.    . norgestimate-ethinyl estradiol (ORTHO-CYCLEN,SPRINTEC,PREVIFEM)  0.25-35 MG-MCG tablet Take 1 tablet by mouth daily. Take 1 tablet by mouth once daily. Take 3 weeks of pack and restart new pack. On the 3rd month, take the last week to have a period.      Allergies:  Allergies  Allergen Reactions  . Niacin And Related Shortness Of Breath    Physical Exam:  Temp:  [98.2 F (36.8 C)-99.2 F (37.3 C)] 98.2 F (36.8 C) (01/15 0730) Pulse Rate:  [75-129] 75 (01/15 0730) Resp:  [14-26] 14 (01/15 0730) BP: (97-119)/(48-73) 103/67 (01/15 0730) SpO2:  [100 %] 100 % (01/15 0730) Weight:  [65.8 kg (145 lb)] 65.8 kg (145 lb) (01/14 2106)   General Appearance:  Well developed, well nourished, no acute distress, alert and oriented x3 HEENT:  Normocephalic atraumatic, extraocular movements intact, moist mucous membranes Cardiovascular:  Normal S1/S2, regular rate and rhythm, no murmurs Pulmonary:  clear to auscultation, no wheezes, rales or rhonchi, symmetric air entry, good air exchange Abdomen:  Bowel sounds present, soft, nontender, nondistended, no abnormal masses, no epigastric pain Extremities:  Full range of motion, no pedal edema, 2+ distal pulses, no tenderness Skin:  normal coloration and turgor, no rashes, no suspicious skin lesions noted  Neurologic:  Cranial nerves 2-12 grossly intact, normal muscle tone, strength 5/5 all four extremities Psychiatric:  Normal mood and affect, appropriate, no AH/VH Pelvic:  Deferred pending [place].    Labs/Studies:   CBC and Coags:  Lab Results  Component Value Date   WBC 12.3 (H) 12/27/2017   HGB 6.2 (L) 12/27/2017   HCT 18.5 (L) 12/27/2017   MCV 91.1 12/27/2017   PLT 311 12/27/2017   CMP:  Lab Results  Component Value Date   NA 136 12/27/2017   K 3.8 12/27/2017   CL 105 12/27/2017   CO2 22 12/27/2017   BUN 12 12/27/2017   CREATININE 0.76 12/27/2017   Other Labs:H&H 2 hrs post 2nd transfusion   TVUS: pending  Other Imaging: US Pelvic Complete With Transvaginal  Result Date: 12/28/2017 CLINICAL  DATA:  Menorrhagia.  Heavy vaginal bleeding for 2 days. EXAM: TRANSABDOMINAL AND TRANSVAGINAL ULTRASOUND OF PELVIS TECHNIQUE: Both transabdominal and transvaginal ultrasound examinations of the pelvis were performed. Transabdominal technique was performed for global imaging of the pelvis including uterus, ovaries, adnexal regions, and pelvic cul-de-sac. It was necessary to proceed with endovaginal exam following the transabdominal exam to visualize the adnexa. COMPARISON:  None FINDINGS: Uterus Measurements: 8.7 x 4.5 x 5.7 cm. No fibroids or other mass visualized. Endometrium Thickness: 6 mm endometrial stripe. 2.2 x 1.5 x 1.4 cm echogenic peripherally vascular mass in RIGHT aspect of endometrium at the cornu. Right ovary Measurements: 2.3 x 0.9 x 1.1 cm. Normal appearance/no adnexal mass. Left ovary Measurements: 3 x 0.8 x 1.2 cm.  Normal appearance/no adnexal mass. Other findings Trace free fluid. IMPRESSION: 1. 2.2 x 1.5 x 1.4 cm solid mass endometrial mass (differential diagnosis  includes polyp and submucosal leiomyoma), recommend sonohysterography. 2. Otherwise negative pelvic ultrasound. Electronically Signed   By: Elon Alas M.D.   On: 12/28/2017 02:31     Assessment / Plan:   Rachel Hunter is a 47 y.o.  who presents with syncope and anemia due to menorrhagia.   1. S/p Lysteda 1 gm IV with slight improvement in bleeding.  Will give 1300mg  TID x 5 days, with high-dose oral estrogen (3x day x 3 days then 2 x day x 3 days then daily)  2. Anemia: s/p 2 units PBRC awaiting repeat CBC.    3. US shows a vascular mass that doesn't appear to be within the endometrium, but adjacent to.  Likely fibroid, and unknown the relationship to her current bleeding episode.  The endometrial stripe appears normal.  I reviewed the imaging myself.   4.  Will consider d/c today if her bleeding is lightening up.  Will need to f/u soon in the office for repeat us/sonohystogram and possible EMB.    ----- Larey Days, MD Attending Obstetrician and Gynecologist [facility], Department of [facility]

## 2017-12-28 NOTE — Plan of Care (Signed)
To mother/baby unit at 0200; will be getting 2nd unit packed red blood cells

## 2017-12-28 NOTE — ED Notes (Signed)
Pt still in [place] called that blood completed. To [place] flushed IV line

## 2017-12-29 ENCOUNTER — Encounter: Payer: Self-pay | Admitting: Obstetrics & Gynecology

## 2017-12-29 LAB — APTT: aPTT: <24 seconds — ABNORMAL LOW (ref 24–36)

## 2017-12-29 LAB — PROTIME-INR
INR: 1.22
Prothrombin Time: 15.3 seconds — ABNORMAL HIGH (ref 11.4–15.2)

## 2017-12-29 LAB — TSH: TSH: 9.432 u[IU]/mL — ABNORMAL HIGH (ref 0.350–4.500)

## 2017-12-29 LAB — FIBRINOGEN: Fibrinogen: 179 mg/dL — ABNORMAL LOW (ref 210–475)

## 2017-12-29 LAB — CBC
HCT: 26.2 % — ABNORMAL LOW (ref 35.0–47.0)
Hemoglobin: 9.2 g/dL — ABNORMAL LOW (ref 12.0–16.0)
MCH: 30.7 pg (ref 26.0–34.0)
MCHC: 35.1 g/dL (ref 32.0–36.0)
MCV: 87.6 fL (ref 80.0–100.0)
PLATELETS: 156 10*3/uL (ref 150–440)
RBC: 2.99 MIL/uL — ABNORMAL LOW (ref 3.80–5.20)
RDW: 14.6 % — AB (ref 11.5–14.5)
WBC: 20 10*3/uL — AB (ref 3.6–11.0)

## 2017-12-29 LAB — HEMOGLOBIN AND HEMATOCRIT, BLOOD

## 2017-12-29 LAB — HIV ANTIBODY (ROUTINE TESTING W REFLEX): HIV Screen 4th Generation wRfx: NONREACTIVE

## 2017-12-29 MED ORDER — SCOPOLAMINE 1 MG/3DAYS TD PT72
1.0000 | MEDICATED_PATCH | TRANSDERMAL | Status: DC
  Administered 2017-12-29: 1.5 mg via TRANSDERMAL
  Filled 2017-12-29: qty 1

## 2017-12-29 MED ORDER — ACETAMINOPHEN 325 MG PO TABS
650.0000 mg | ORAL_TABLET | ORAL | Status: DC
  Administered 2017-12-29 – 2017-12-30 (×8): 650 mg via ORAL
  Filled 2017-12-29 (×8): qty 2

## 2017-12-29 MED ORDER — CEFAZOLIN SODIUM-DEXTROSE 1-4 GM/50ML-% IV SOLN: 1.0000 g | Freq: Three times a day (TID) | INTRAVENOUS | Status: DC

## 2017-12-29 MED ORDER — DEXTROSE 5 % IV SOLN
Freq: Three times a day (TID) | INTRAVENOUS | Status: DC
  Administered 2017-12-29 – 2017-12-30 (×2): via INTRAVENOUS
  Filled 2017-12-29 (×3): qty 10

## 2017-12-29 MED ORDER — CEFAZOLIN SODIUM-DEXTROSE 1-4 GM-%(50ML) IV SOLR
1.0000 g | Freq: Once | INTRAVENOUS | Status: AC
  Administered 2017-12-29: 1 g via INTRAVENOUS
  Filled 2017-12-29: qty 10
  Filled 2017-12-29: qty 50

## 2017-12-29 MED ORDER — CEFAZOLIN SODIUM-DEXTROSE 1-4 GM/50ML-% IV SOLN
1.0000 g | Freq: Once | INTRAVENOUS | Status: DC
  Filled 2017-12-29: qty 50

## 2017-12-29 MED ORDER — SIMETHICONE 80 MG PO CHEW: 160.0000 mg | CHEWABLE_TABLET | Freq: Four times a day (QID) | ORAL | Status: DC | PRN

## 2017-12-29 MED ORDER — HYDROMORPHONE HCL 1 MG/ML IJ SOLN: 0.2000 mg | INTRAMUSCULAR | Status: DC | PRN

## 2017-12-29 MED ORDER — ONDANSETRON HCL 4 MG/2ML IJ SOLN: 4.0000 mg | Freq: Four times a day (QID) | INTRAMUSCULAR | Status: DC | PRN

## 2017-12-29 MED ORDER — TRAMADOL HCL 50 MG PO TABS: 50.0000 mg | ORAL_TABLET | Freq: Four times a day (QID) | ORAL | Status: DC | PRN

## 2017-12-29 MED ORDER — ONDANSETRON HCL 4 MG PO TABS: 4.0000 mg | ORAL_TABLET | Freq: Four times a day (QID) | ORAL | Status: DC | PRN

## 2017-12-29 MED ORDER — CEFAZOLIN SODIUM-DEXTROSE 1-4 GM-%(50ML) IV SOLR
1.0000 g | Freq: Once | INTRAVENOUS | Status: DC
  Filled 2017-12-29: qty 50

## 2017-12-29 MED ORDER — MENTHOL 3 MG MT LOZG
1.0000 | LOZENGE | OROMUCOSAL | Status: DC | PRN
  Filled 2017-12-29: qty 9

## 2017-12-29 MED ORDER — DOCUSATE SODIUM 100 MG PO CAPS
100.0000 mg | ORAL_CAPSULE | Freq: Two times a day (BID) | ORAL | Status: DC
  Administered 2017-12-29 – 2017-12-30 (×3): 100 mg via ORAL
  Filled 2017-12-29 (×3): qty 1

## 2017-12-29 MED ORDER — SODIUM CHLORIDE 0.9 % IV SOLN: Freq: Once | INTRAVENOUS | Status: DC

## 2017-12-29 NOTE — Progress Notes (Signed)
1 Day Post-Op       Procedure(s): DILATATION and curretage, hysteroscopy, balloon tamponade in uterus (N/A) Subjective: The patient is doing well.  No nausea or vomiting. Pain is adequately controlled. Bleeding minimal. Foley uterine tamponade still in place. +spontaneous voiding, tolerating clears. No sx of dizziness on ambulation or orthostatic hypotension.  S/p 2 more units of pRBCs yesterday and overnight. Total of 4 u pRBCs so far during this admission  Objective: Vital signs in last 24 hours: Temp:  [97.6 F (36.4 C)-99.2 F (37.3 C)] 98.6 F (37 C) (01/16 0755) Pulse Rate:  [71-119] 77 (01/16 0755) Resp:  [11-26] 16 (01/16 0755) BP: (84-123)/(48-80) 100/51 (01/16 0755) SpO2:  [96 %-100 %] 98 % (01/16 0755)  Intake/Output  Intake/Output Summary (Last 24 hours) at 12/29/2017 0912 Last data filed at 12/29/2017 0755 Gross per 24 hour  Intake 4305 ml  Output 1730 ml  Net 2575 ml    Physical Exam:  General: Alert and oriented. CV: RRR Lungs: Clear bilaterally. GI: Soft, Nondistended. Urine: Clear Extremities: Nontender, no erythema, no edema.  Lab Results: Recent Labs    12/27/17 2138 12/28/17 0736 12/28/17 1436 12/28/17 2307  HGB 6.2* 8.0* 6.0* 8.4*  HCT 18.5* 22.8* 17.2* 24.4*  WBC 12.3*  --  11.6* 18.2*  PLT 311  --  200 199                 Results for orders placed or performed during the hospital encounter of 12/27/17 (from the past 24 hour(s))  BLOOD TRANSFUSION REPORT - SCANNED     Status: None   Collection Time: 12/28/17 10:34 AM   Narrative   Ordered by an unspecified provider.  CBC     Status: Abnormal   Collection Time: 12/28/17  2:36 PM  Result Value Ref Range   WBC 11.6 (H) 3.6 - 11.0 K/uL   RBC 1.96 (L) 3.80 - 5.20 MIL/uL   Hemoglobin 6.0 (L) 12.0 - 16.0 g/dL   HCT 17.2 (L) 35.0 - 47.0 %   MCV 87.5 80.0 - 100.0 fL   MCH 30.5 26.0 - 34.0 pg   MCHC 34.9 32.0 - 36.0 g/dL   RDW 13.8 11.5 - 14.5 %   Platelets 200 150 - 440 K/uL  Prepare RBC      Status: None   Collection Time: 12/28/17  4:00 PM  Result Value Ref Range   Order Confirmation      ORDER PROCESSED BY BLOOD BANK Performed at [facility], [address] [place], [place] [address]   Prepare RBC     Status: None   Collection Time: 12/28/17  9:30 PM  Result Value Ref Range   Order Confirmation      ORDER PROCESSED BY BLOOD BANK Performed at [facility], [address] [place], [place] [address]   CBC     Status: Abnormal   Collection Time: 12/28/17 11:07 PM  Result Value Ref Range   WBC 18.2 (H) 3.6 - 11.0 K/uL   RBC 2.78 (L) 3.80 - 5.20 MIL/uL   Hemoglobin 8.4 (L) 12.0 - 16.0 g/dL   HCT 24.4 (L) 35.0 - 47.0 %   MCV 87.8 80.0 - 100.0 fL   MCH 30.1 26.0 - 34.0 pg   MCHC 34.3 32.0 - 36.0 g/dL   RDW 14.5 11.5 - 14.5 %   Platelets 199 150 - 440 K/uL  Fibrinogen (coagulopathy lab panel)     Status: Abnormal   Collection Time: 12/28/17 11:07 PM  Result Value Ref Range   Fibrinogen 179 (L) 210 - 475 mg/dL  Protime-INR (coagulopathy lab panel)     Status: Abnormal   Collection Time: 12/28/17 11:07 PM  Result Value Ref Range   Prothrombin Time 15.3 (H) 11.4 - 15.2 seconds   INR 1.22   APTT (coagulopathy lab panel)     Status: Abnormal   Collection Time: 12/28/17 11:07 PM  Result Value Ref Range   aPTT <24 (L) 24 - 36 seconds  TSH     Status: Abnormal   Collection Time: 12/28/17 11:07 PM  Result Value Ref Range   TSH 9.432 (H) 0.350 - 4.500 uIU/mL  Prepare fresh frozen plasma     Status: None (Preliminary result)   Collection Time: 12/29/17  2:30 AM  Result Value Ref Range   Unit Number [id number]    Blood Component Type THAWED PLASMA    Unit division 00    Status of Unit ISSUED    Transfusion Status      OK TO TRANSFUSE Performed at [facility], [address], [place], [place] [address]     Assessment/Plan: 1 Day Post-Op       Procedure(s): DILATATION and curretage, hysteroscopy, balloon tamponade in uterus  (N/A)  1) Ambulate, Incentive spirometry 2) Advance diet as tolerated 3) Plan for 24hrs of tamponade with removal at 11:00 today 4) Repeat CBC now 5) Monitor clinical status   Benjaman Kindler, MD   LOS: 1 day   Benjaman Kindler 12/29/2017, 9:12 AM

## 2017-12-29 NOTE — Progress Notes (Signed)
  FB is out. Minimal vaginal bleeding.  Repeat CBC in am.  Monitor WBC in am Monitor pad counts overnight Continue ancef x1 more dose

## 2017-12-29 NOTE — Progress Notes (Signed)
Patient ID: Rachel Hunter, female   DOB: [birth year], 47 y.o.   MRN: [id number]  10cc of uterine tamponade removed without complication WBC 20 - continue abx - plan for removal of FB entirely in 6 hrs.

## 2017-12-29 NOTE — Anesthesia Postprocedure Evaluation (Signed)
Anesthesia Post Note  Patient: Rachel Hunter  Procedure(s) Performed: DILATATION and curretage, hysteroscopy, balloon tamponade in uterus (N/A Uterus)  Patient location during evaluation: PACU Anesthesia Type: General Level of consciousness: awake and alert Pain management: pain level controlled Vital Signs Assessment: post-procedure vital signs reviewed and stable Respiratory status: spontaneous breathing and respiratory function stable Cardiovascular status: stable Anesthetic complications: no     Last Vitals:  Vitals:   12/29/17 0022 12/29/17 0132  BP: 114/64 (!) 98/51  Pulse: 88 85  Resp: 16 16  Temp: 36.6 C 36.7 C  SpO2: 100% 100%    Last Pain:  Vitals:   12/29/17 0132  TempSrc: Oral  PainSc:                  KEPHART,WILLIAM K

## 2017-12-30 LAB — TYPE AND SCREEN
ABO/RH(D): A POS
ANTIBODY SCREEN: NEGATIVE
UNIT DIVISION: 0
UNIT DIVISION: 0
UNIT DIVISION: 0
UNIT DIVISION: 0
Unit division: 0
Unit division: 0
Unit division: 0
Unit division: 0

## 2017-12-30 LAB — BPAM RBC
BLOOD PRODUCT EXPIRATION DATE: 201901242359
Blood Product Expiration Date: 201901252359
Blood Product Expiration Date: 201901262359
Blood Product Expiration Date: 201901292359
Blood Product Expiration Date: 201901292359
Blood Product Expiration Date: 201902012359
Blood Product Expiration Date: 201902012359
Blood Product Expiration Date: 201902092359
ISSUE DATE / TIME: 201901142303
ISSUE DATE / TIME: 201901150229
ISSUE DATE / TIME: 201901151924
ISSUE DATE / TIME: 201901151604
ISSUE DATE / TIME: 201901160129
ISSUE DATE / TIME: 201901161526
UNIT TYPE AND RH: 6200
UNIT TYPE AND RH: 6200
UNIT TYPE AND RH: 9500
UNIT TYPE AND RH: 9500
UNIT TYPE AND RH: 9500
Unit Type and Rh: 6200
Unit Type and Rh: 6200
Unit Type and Rh: 9500

## 2017-12-30 LAB — CBC
HEMATOCRIT: 23.3 % — AB (ref 35.0–47.0)
HEMOGLOBIN: 8.2 g/dL — AB (ref 12.0–16.0)
MCH: 31.1 pg (ref 26.0–34.0)
MCHC: 35.1 g/dL (ref 32.0–36.0)
MCV: 88.7 fL (ref 80.0–100.0)
Platelets: 156 10*3/uL (ref 150–440)
RBC: 2.63 MIL/uL — AB (ref 3.80–5.20)
RDW: 14.8 % — ABNORMAL HIGH (ref 11.5–14.5)
WBC: 13 10*3/uL — AB (ref 3.6–11.0)

## 2017-12-30 LAB — BPAM FFP
BLOOD PRODUCT EXPIRATION DATE: 201901212359
ISSUE DATE / TIME: 201901160608
UNIT TYPE AND RH: 6200

## 2017-12-30 LAB — PREPARE FRESH FROZEN PLASMA: Unit division: 0

## 2017-12-30 LAB — HEMOGLOBIN AND HEMATOCRIT, BLOOD
HEMATOCRIT: 23.8 % — AB (ref 35.0–47.0)
Hemoglobin: 8.6 g/dL — ABNORMAL LOW (ref 12.0–16.0)

## 2017-12-30 LAB — SURGICAL PATHOLOGY

## 2017-12-30 MED ORDER — FERROUS SULFATE 325 (65 FE) MG PO TABS: 325.0000 mg | ORAL_TABLET | Freq: Two times a day (BID) | ORAL | 11 refills | Status: AC

## 2017-12-30 NOTE — Discharge Summary (Signed)
Gynecology Physician Postoperative Discharge Summary  Patient ID: Rachel Hunter MRN: [id number] DOB/AGE: 1971-08-09 47 y.o.  Admit Date: 12/27/2017 Discharge Date: 12/30/2017  Admitting Diagnoses: uterine hemorrhage, symptomatic anemia Discharge Diagnoses:  Uterine hemorrhage, acute blood loss anemia, thrombocytopenia  Procedures: Procedure(s) (LRB): DILATATION and curretage, hysteroscopy, balloon tamponade in uterus (N/A)  CBC Latest Ref Rng & Units 12/30/2017 12/30/2017 12/29/2017  WBC 3.6 - 11.0 K/uL - 13.0(H) 20.0(H)  Hemoglobin 12.0 - 16.0 g/dL 8.6(L) 8.2(L) 9.2(L)  Hematocrit 35.0 - 47.0 % 23.8(L) 23.3(L) 26.2(L)  Platelets 150 - 440 K/uL - [place] Hospital Course:  Rachel Hunter is a 47 y.o. who presented to the ED after two syncopal episodes related to her vaginal bleeding.  She began bleeding heavily and passing large and frequent clots.  Her admission hemoglobin was 6.2, with nadir at 6.0 and she received 5u PRBC and 1u FFP.  She had a D&C with hysteroscopy and balloon tamponade.  She received IV and PO estrogen, IV and PO lysteda.  After all of this, her bleeding subsided.  Her Hb rose to 8.4 and remained stable there.  For further details about surgery, please refer to the operative report. She was deemed stable for discharge to home.   Discharge Exam: Blood pressure (!) 99/53, pulse 80, temperature 98.7 F (37.1 C), temperature source Oral, resp. rate 18, height 5\' 6"  (1.676 m), weight 65.8 kg (145 lb), last menstrual period 12/13/2017, SpO2 97 %. General appearance: alert and no distress  Resp: clear to auscultation bilaterally, normal respiratory effort Cardio: regular rate and rhythm  GI: soft, non-tender; bowel sounds normal; no masses, no organomegaly.  Pelvic: no blood on pad  Extremities: extremities normal, atraumatic, no cyanosis or edema and Homans sign is negative, no sign of DVT  Discharged Condition: Stable  Disposition:   Discharge Instructions    Diet -  low sodium heart healthy   Complete by:  As directed    Increase activity slowly   Complete by:  As directed      Allergies as of 12/30/2017      Reactions   Niacin And Related Shortness Of Breath      Medication List    TAKE these medications   acidophilus Caps capsule Take 1 capsule by mouth daily.   ascorbic acid 500 MG tablet Commonly known as:  VITAMIN C Take 500 mg by mouth daily.   Calcium Carbonate 500 MG Chew Chew 1,000 mg by mouth daily.   DOCOSAHEXAENOIC ACID PO Take 1 capsule by mouth daily.   ferrous sulfate 325 (65 FE) MG tablet Take 1 tablet (325 mg total) by mouth 2 (two) times daily with a meal.   norgestimate-ethinyl estradiol 0.25-35 MG-MCG tablet Commonly known as:  ORTHO-CYCLEN,SPRINTEC,PREVIFEM Take 1 tablet by mouth daily. Take 1 tablet by mouth once daily. Take 3 weeks of pack and restart new pack. On the 3rd month, take the last week to have a period.   Vitamin D 2000 units tablet Take 2,000 Units by mouth daily.   ZYRTEC ALLERGY 10 MG tablet Generic drug:  cetirizine Take 10 mg by mouth daily.      Follow-up Information    Ward, Honor Loh, MD Follow up in 2 week(s).   Specialty:  Obstetrics and Gynecology Contact information: [place] [address] [phone]           Signed:  [place] Attending [place] [facility]

## 2017-12-30 NOTE — Progress Notes (Signed)
Discharge order received from doctor. Reviewed discharge instructions and prescriptions with patient and answered all questions. Follow up appointment instructions given. Patient verbalized understanding. Patient discharged home via wheelchair by nursing/auxillary.     Mahasin Riviere Garner, RN  

## 2017-12-30 NOTE — Discharge Instructions (Signed)
Please call office if bleeding remains heavy or persists after you stop your medications. Return to the ED if you have another syncopal episode.

## 2018-01-31 ENCOUNTER — Ambulatory Visit
Admission: RE | Admit: 2018-01-31 | Discharge: 2018-01-31 | Disposition: A | Discharge status: Home / Self Care | Payer: BLUE CROSS/BLUE SHIELD | Source: Ambulatory Visit | Attending: Oncology | Admitting: Oncology

## 2018-01-31 ENCOUNTER — Other Ambulatory Visit: Payer: Self-pay

## 2018-01-31 DIAGNOSIS — N6322 Unspecified lump in the left breast, upper inner quadrant: Secondary | ICD-10-CM | POA: Insufficient documentation

## 2018-01-31 DIAGNOSIS — N63 Unspecified lump in unspecified breast: Secondary | ICD-10-CM

## 2018-01-31 DIAGNOSIS — N6320 Unspecified lump in the left breast, unspecified quadrant: Secondary | ICD-10-CM | POA: Diagnosis present

## 2018-01-31 DIAGNOSIS — N6310 Unspecified lump in the right breast, unspecified quadrant: Secondary | ICD-10-CM | POA: Diagnosis present

## 2018-01-31 NOTE — Progress Notes (Signed)
Radiologist reviewed Birads 3 mammogram and left breast ultrasound results with patient.  Recommend follow-up in one year.  Scheduled for [facility] appointment 02/01/19 at 1:00 with mammogram/ultrasound to follow at 2:20.  Patient notified of appointment.  Mailed appointment info.  Copy to [facility].

## 2018-09-09 ENCOUNTER — Other Ambulatory Visit: Payer: Self-pay | Admitting: Internal Medicine

## 2018-09-09 DIAGNOSIS — R1011 Right upper quadrant pain: Secondary | ICD-10-CM

## 2018-09-13 ENCOUNTER — Encounter (INDEPENDENT_AMBULATORY_CARE_PROVIDER_SITE_OTHER): Payer: Self-pay

## 2018-09-13 ENCOUNTER — Ambulatory Visit
Admission: RE | Admit: 2018-09-13 | Discharge: 2018-09-13 | Disposition: A | Discharge status: Home / Self Care | Payer: BLUE CROSS/BLUE SHIELD | Source: Ambulatory Visit | Attending: Internal Medicine | Admitting: Internal Medicine

## 2018-09-13 DIAGNOSIS — R1011 Right upper quadrant pain: Secondary | ICD-10-CM | POA: Diagnosis present

## 2018-09-13 DIAGNOSIS — K828 Other specified diseases of gallbladder: Secondary | ICD-10-CM | POA: Insufficient documentation

## 2018-10-07 ENCOUNTER — Ambulatory Visit (INDEPENDENT_AMBULATORY_CARE_PROVIDER_SITE_OTHER): Payer: BLUE CROSS/BLUE SHIELD | Admitting: Urology

## 2018-10-07 ENCOUNTER — Encounter: Payer: Self-pay | Admitting: Urology

## 2018-10-07 VITALS — BP 151/80 | HR 90 | Ht 66.0 in | Wt 160.0 lb

## 2018-10-07 DIAGNOSIS — R3129 Other microscopic hematuria: Secondary | ICD-10-CM

## 2018-10-07 NOTE — Progress Notes (Signed)
10/07/2018 9:52 AM   Rachel Hunter 03-Dec-1971 [id number]  Referring provider: Tracie Harrier, MD [address] [facility] [place], [place] [address]  Chief Complaint  Patient presents with  . Hematuria    New patient    HPI: 47 year old female with a history of microscopic hematuria who presents today for further evaluation of this.  She has been noted to have microscopic blood on several urinalyses over the past year including 12/16/2017, 07/21/2018, and 09/09/2018.  Each of these are otherwise unremarkable without evidence of concomitant infection.  4-10 red blood cells are present on high-powered field on each of these urinalysis.  He does have a history of irregular and heavy menstrual cycles.  In fact, she presented in January 2019 to the emergency room with a hemoglobin of 6 secondary to uterine hemorrhage.  She required a D&C in [place] not balloon.  She now has an IUD in place.  She has intermittent light spotting.  She does believe one occasion her blood was seen in her urine, she was spotting.  She does not think she was spotting at other occasions.  She denies personal history of gross hematuria.  No history of kidney stones.  No family history of GU malignancy.  She is a non-smoker.  She was exposed to secondhand smoke as a child.  PMH: Past Medical History:  Diagnosis Date  . Adjustment disorder with physical complaints 10/26/2017  . Anemia 12/27/2017  . 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 AUGMENTATION MAMMAPLASTY Bilateral 2007  . BREAST BIOPSY Left 07/29/2017    FIBROADENOMA  . DILATION AND EVACUATION N/A 12/28/2017   Procedure: DILATATION and curretage, hysteroscopy, balloon tamponade in uterus;  Surgeon: Ward, Honor Loh, MD;  Location: [facility] ORS;  Service: Gynecology;  Laterality: N/A;    Home Medications:  Allergies as of 10/07/2018      Reactions   Niacin And  Related Shortness Of Breath      Medication List        Accurate as of 10/07/18  9:52 AM. Always use your most recent med list.          ascorbic acid 500 MG tablet Commonly known as:  VITAMIN C Take 500 mg by mouth daily.   atorvastatin 10 MG tablet Commonly known as:  LIPITOR Take 10 mg by mouth daily.   Calcium Carbonate 500 MG Chew Chew 1,000 mg by mouth daily.   ferrous sulfate 325 (65 FE) MG tablet Take 1 tablet (325 mg total) by mouth 2 (two) times daily with a meal.   Magnesium 100 MG Caps Take by mouth.   norgestimate-ethinyl estradiol 0.25-35 MG-MCG tablet Commonly known as:  ORTHO-CYCLEN,SPRINTEC,PREVIFEM Take 1 tablet by mouth daily. Take 1 tablet by mouth once daily. Take 3 weeks of pack and restart new pack. On the 3rd month, take the last week to have a period.   TURMERIC PO Take by mouth.   ZYRTEC ALLERGY 10 MG tablet Generic drug:  cetirizine Take 10 mg by mouth daily.       Allergies:  Allergies  Allergen Reactions  . Niacin And Related Shortness Of Breath    Family History: Family History  Problem Relation Age of Onset  . Breast cancer Paternal Grandmother 37    Social History:  reports that she has never smoked. She has never used smokeless tobacco. She reports that she does not drink alcohol or use drugs.  ROS: UROLOGY Frequent Urination?: No Hard to postpone urination?: No Burning/pain with urination?: No Get up at night to urinate?: No Leakage of urine?: No Urine stream starts and stops?: No Trouble starting stream?: No Do you have to strain to urinate?: No Blood in urine?: Yes Urinary tract infection?: No Sexually transmitted disease?: No Injury to kidneys or bladder?: No Painful intercourse?: No Weak stream?: No Currently pregnant?: No Vaginal bleeding?: Yes Last menstrual period?: n  Gastrointestinal Nausea?: No Vomiting?: No Indigestion/heartburn?: No Diarrhea?: No Constipation?: No  Constitutional Fever:  No Night sweats?: No Weight loss?: No Fatigue?: Yes  Skin Skin rash/lesions?: No Itching?: No  Eyes Blurred vision?: No Double vision?: No  Ears/Nose/Throat Sore throat?: No Sinus problems?: No  Hematologic/Lymphatic Swollen glands?: No Easy bruising?: No  Cardiovascular Leg swelling?: No Chest pain?: No  Respiratory Cough?: No Shortness of breath?: No  Endocrine Excessive thirst?: No  Musculoskeletal Back pain?: Yes Joint pain?: Yes  Neurological Headaches?: No Dizziness?: No  Psychologic Depression?: No Anxiety?: Yes  Physical Exam: BP (!) 151/80   Pulse 90   Ht 5\' 6"  (1.676 m)   Wt 160 lb (72.6 kg)   BMI 25.82 kg/m   Constitutional:  Alert and oriented, No acute distress. HEENT: [place] AT, moist mucus membranes.  Trachea midline, no masses. Cardiovascular: No clubbing, cyanosis, or edema. Respiratory: Normal respiratory effort, no increased work of breathing. GI: Abdomen is soft, nontender, nondistended, no abdominal masses GU: No CVA tenderness Skin: No rashes, bruises or suspicious lesions. Neurologic: Grossly intact, no focal deficits, moving all 4 extremities. Psychiatric: Normal mood and affect.  Laboratory Data: Lab Results  Component Value Date   WBC 13.0 (H) 12/30/2017   HGB 8.6 (L) 12/30/2017   HCT 23.8 (L) 12/30/2017   MCV 88.7 12/30/2017   PLT 156 12/30/2017    Lab Results  Component Value Date   CREATININE 0.76 12/27/2017    Urinalysis UA was not repeated today, is microscopic but has been seen on 3 different occasions over the past 12 months, care everywhere  Pertinent Imaging: No recent cross-sectional imaging  Assessment & Plan:    1. Microscopic hematuria We discussed the differential diagnosis for microscopic hematuria including nephrolithiasis, renal or upper tract tumors, bladder stones, UTIs, or bladder tumors as well as undetermined etiologies. Per AUA guidelines, I did recommend complete microscopic  hematuria evaluation including CTU, possible urine cytology, and office cystoscopy. - CT HEMATURIA WORKUP; Future   Return in about 4 weeks (around 11/04/2018) for cysto/ f/u CT.  Hollice Espy, MD  [facility] [address], [place], [place] [address] [phone]

## 2018-10-18 ENCOUNTER — Telehealth: Payer: Self-pay

## 2018-10-18 ENCOUNTER — Ambulatory Visit
Admission: RE | Admit: 2018-10-18 | Discharge: 2018-10-18 | Disposition: A | Discharge status: Home / Self Care | Payer: BLUE CROSS/BLUE SHIELD | Source: Ambulatory Visit | Attending: Urology | Admitting: Urology

## 2018-10-18 ENCOUNTER — Encounter: Payer: Self-pay | Admitting: Radiology

## 2018-10-18 DIAGNOSIS — R3129 Other microscopic hematuria: Secondary | ICD-10-CM

## 2018-10-18 MED ORDER — IOHEXOL 300 MG/ML  SOLN
150.0000 mL | Freq: Once | INTRAMUSCULAR | Status: AC | PRN
  Administered 2018-10-18: 125 mL via INTRAVENOUS

## 2018-10-18 NOTE — Telephone Encounter (Signed)
Pt informed

## 2018-10-18 NOTE — Telephone Encounter (Signed)
-----   Message from Hollice Espy, MD sent at 10/18/2018 12:59 PM EST ----- Please let this patient know that her CT scan looks fine.  Please keep your follow-up for cystoscopy.

## 2018-11-04 ENCOUNTER — Other Ambulatory Visit
Admission: RE | Admit: 2018-11-04 | Discharge: 2018-11-04 | Disposition: A | Discharge status: Home / Self Care | Payer: BLUE CROSS/BLUE SHIELD | Source: Ambulatory Visit | Attending: Urology | Admitting: Urology

## 2018-11-04 ENCOUNTER — Ambulatory Visit (INDEPENDENT_AMBULATORY_CARE_PROVIDER_SITE_OTHER): Payer: BLUE CROSS/BLUE SHIELD | Admitting: Urology

## 2018-11-04 ENCOUNTER — Encounter: Payer: Self-pay | Admitting: Urology

## 2018-11-04 ENCOUNTER — Other Ambulatory Visit: Payer: Self-pay

## 2018-11-04 ENCOUNTER — Encounter (INDEPENDENT_AMBULATORY_CARE_PROVIDER_SITE_OTHER): Payer: Self-pay

## 2018-11-04 VITALS — BP 154/95 | HR 82 | Wt 166.0 lb

## 2018-11-04 DIAGNOSIS — R3129 Other microscopic hematuria: Secondary | ICD-10-CM | POA: Diagnosis present

## 2018-11-04 LAB — URINALYSIS, COMPLETE (UACMP) WITH MICROSCOPIC
BILIRUBIN URINE: NEGATIVE
Glucose, UA: NEGATIVE mg/dL
Leukocytes, UA: NEGATIVE
Nitrite: NEGATIVE
PROTEIN: 30 mg/dL — AB
SPECIFIC GRAVITY, URINE: 1.02 (ref 1.005–1.030)
pH: 7 (ref 5.0–8.0)

## 2018-11-04 NOTE — Progress Notes (Signed)
   11/04/18  CC:  Chief Complaint  Patient presents with  . Procedure    Cystoscopy    HPI: 47 year old female with microscopic hematuria who presents today to complete her hematuria work-up.  She underwent a CT urogram completed on 10/18/2018 which showed no GU pathology.  This was personally reviewed today.  Blood pressure (!) 154/95, pulse 82, weight 166 lb (75.3 kg). NED. A&Ox3.   No respiratory distress   Abd soft, NT, ND Normal external genitalia with patent urethral meatus  Cystoscopy Procedure Note  Patient identification was confirmed, informed consent was obtained, and patient was prepped using Betadine solution.  Lidocaine jelly was administered per urethral meatus.    Procedure: - Flexible cystoscope introduced, without any difficulty.   - Thorough search of the bladder revealed:    normal urethral meatus    normal urothelium    no stones    no ulcers     no tumors    no urethral polyps    no trabeculation    Uterine impression appreciated posteriorly with mass-effect into the bladder  - Ureteral orifices were normal in position and appearance.  Post-Procedure: - Patient tolerated the procedure well  Assessment/ Plan:  1. Microscopic hematuria This was negative microscopic hematuria work-up including negative cystoscopy today CT urogram was personally reviewed, negative for pathology I have advised that she return to the office and 2 to 3 years if she continues to have microscopic blood in her urine for reassessment She is agreeable this plan - Urinalysis, Complete w Microscopic (For [facility] ONLY); Future   Hollice Espy, MD

## 2019-02-01 ENCOUNTER — Other Ambulatory Visit: Payer: BLUE CROSS/BLUE SHIELD

## 2019-02-01 ENCOUNTER — Ambulatory Visit: Payer: Self-pay

## 2019-02-01 ENCOUNTER — Other Ambulatory Visit: Payer: Self-pay | Admitting: Obstetrics & Gynecology

## 2019-02-01 DIAGNOSIS — Z1239 Encounter for other screening for malignant neoplasm of breast: Secondary | ICD-10-CM

## 2019-02-01 DIAGNOSIS — N6320 Unspecified lump in the left breast, unspecified quadrant: Secondary | ICD-10-CM

## 2019-02-02 ENCOUNTER — Ambulatory Visit
Admission: RE | Admit: 2019-02-02 | Discharge: 2019-02-02 | Disposition: A | Discharge status: Home / Self Care | Payer: BLUE CROSS/BLUE SHIELD | Source: Ambulatory Visit | Attending: Oncology | Admitting: Oncology

## 2019-02-02 ENCOUNTER — Other Ambulatory Visit: Payer: Self-pay | Admitting: Obstetrics & Gynecology

## 2019-02-02 DIAGNOSIS — Z1239 Encounter for other screening for malignant neoplasm of breast: Secondary | ICD-10-CM

## 2019-02-02 DIAGNOSIS — N6320 Unspecified lump in the left breast, unspecified quadrant: Secondary | ICD-10-CM

## 2019-03-30 IMAGING — MG 2D DIGITAL DIAGNOSTIC UNILATERAL RIGHT MAMMOGRAM WITH CAD AND AD
8 of 9 series · 8 of 17 positions shown · non-contrast
Comparison: Previous exam(s).

CLINICAL DATA: Patient recalled from screening for left breast
masses.

EXAM:
2D DIGITAL DIAGNOSTIC LEFT MAMMOGRAM WITH IMPLANTS, CAD AND ADJUNCT
TOMO
ULTRASOUND LEFT BREAST
The patient has retropectoral implants. Standard and implant
displaced views were performed.

[L MLO synth-2D]
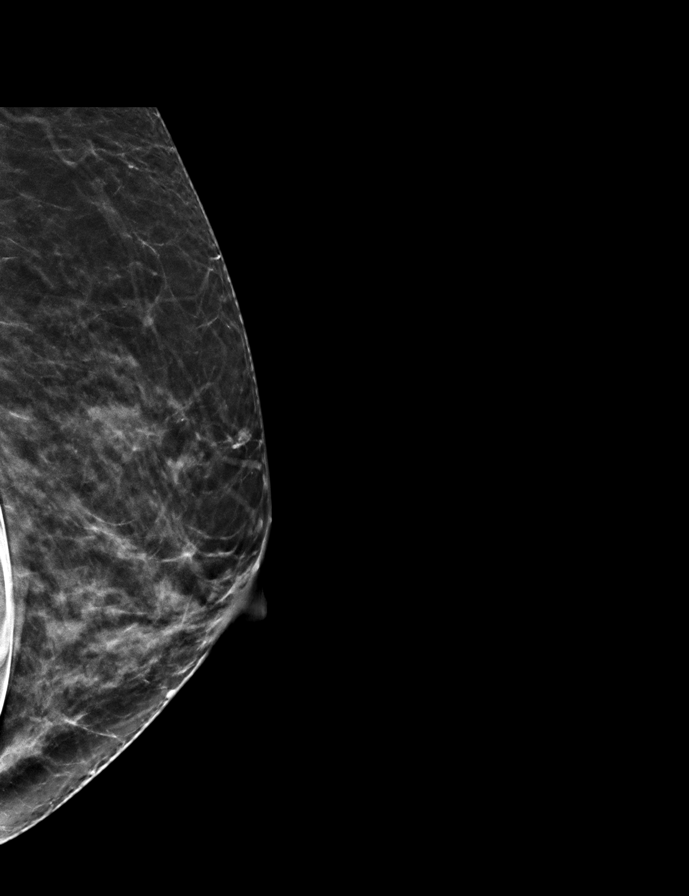

[L MLO]
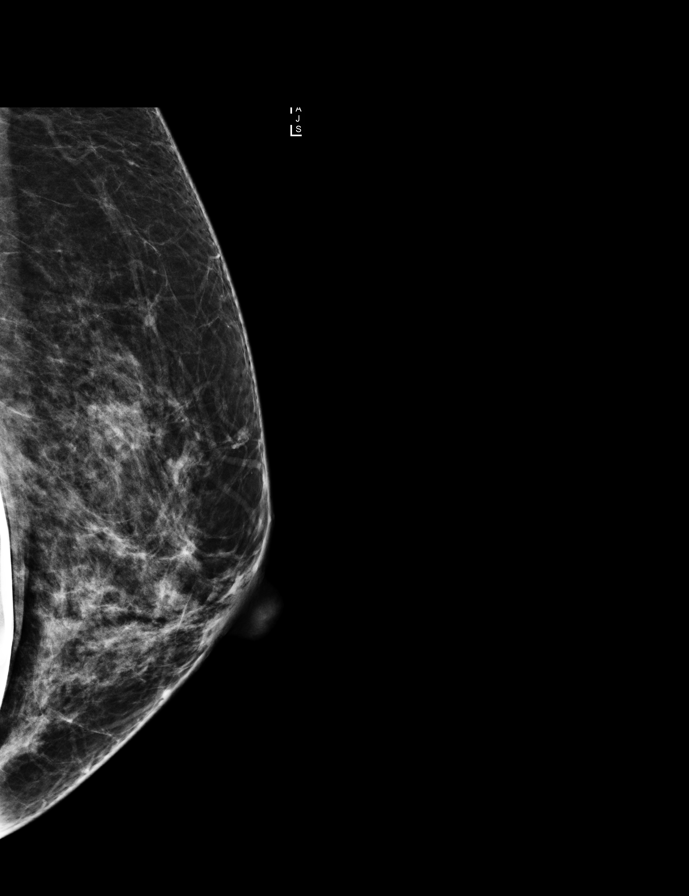

[L CC (1 of 4)]
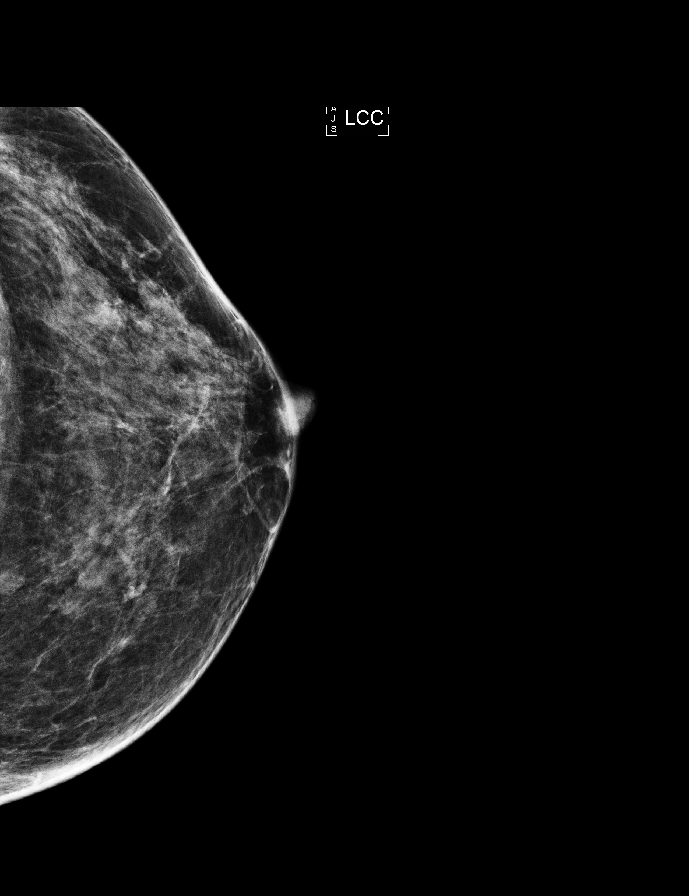

[L CC synth-2D]
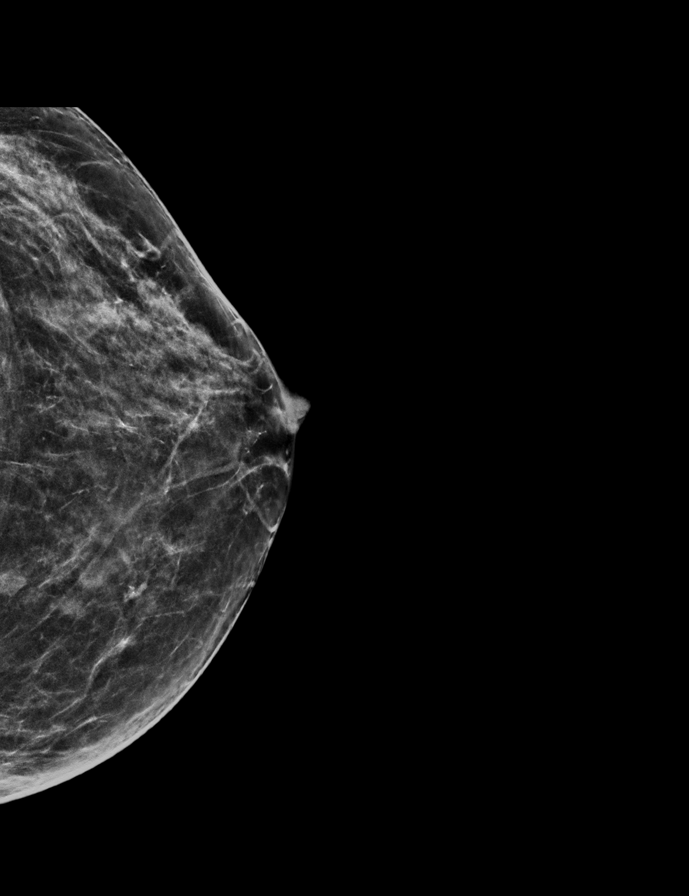

[L MLO tomo · tomo slice 27/54.0]
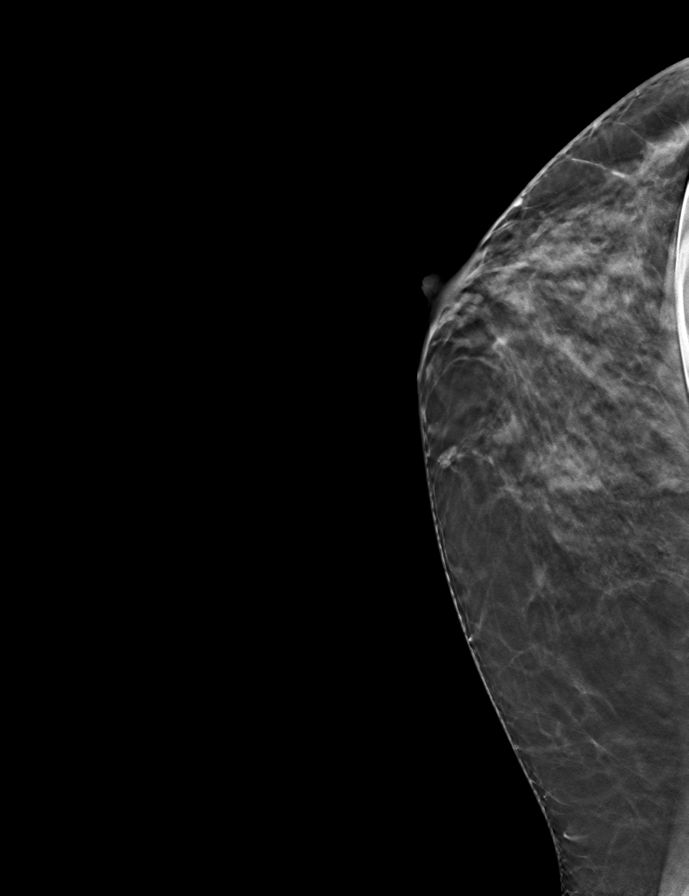

[L CC (2 of 4)]
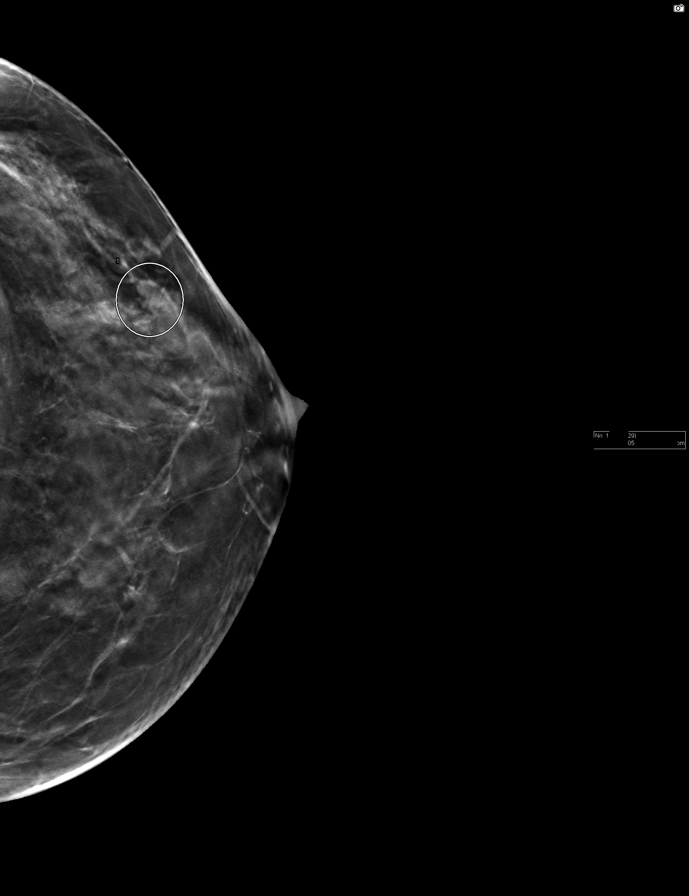

[L CC (3 of 4)]
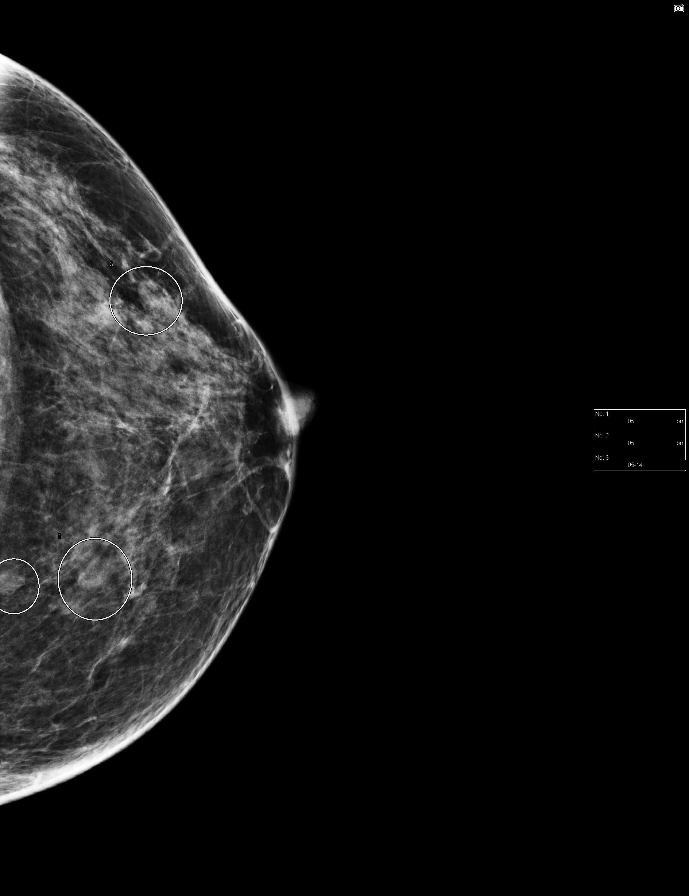

[L CC (4 of 4)]
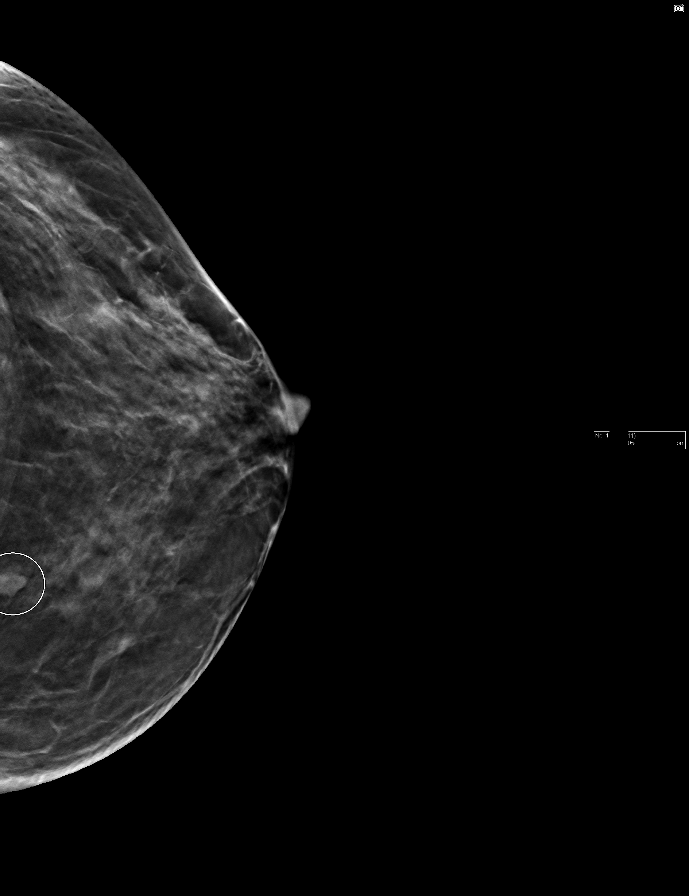

[8 of 17 positions shown; findings below may reference images not displayed]

ACR Breast Density Category c: The breast tissue is heterogeneously
dense, which may obscure small masses.
FINDINGS: Within the medial aspect of the left breast superiorly there is a
small lobular mass. Within medial aspect of left breast there is an
adjacent lobular mass. There is a circumscribed mass within the
lateral aspect of the left breast anterior depth. These masses were
further evaluated with tomosynthesis imaging.

On physical exam, I palpate no discrete mass the medial or lateral
left breast.

Targeted ultrasound is performed, showing a 4 x 10 x 7 mm lobular
hypoechoic left breast mass 7 o'clock position 4 cm from the nipple.
There is a 5 x 6 x 2 mm oval circumscribed hypoechoic mass left
breast 10 o'clock position 4 cm from nipple. There is a 7 mm
intramammary lymph node left breast 130 o'clock 3 cm from the
nipple. No left axillary lymphadenopathy.

Mammographic images were processed with CAD.
IMPRESSION: Indeterminate irregular hypoechoic left breast mass 7 o'clock
position.

Probable complicated cyst left breast 10 o'clock position.

Intramammary lymph node left breast 130 o'clock.

RECOMMENDATION:
Ultrasound-guided core needle biopsy indeterminate hypoechoic mass
left breast 7 o'clock position.

If this demonstrates benign pathology, recommend six-month follow-up
left breast mammogram and ultrasound to ensure stability of
additional left breast mass, favored to represent a complicated cyst
10 o'clock position.

I have discussed the findings and recommendations with the patient.
Results were also provided in writing at the conclusion of the
visit. If applicable, a reminder letter will be sent to the patient
regarding the next appointment.

BI-RADS CATEGORY  4: Suspicious.

## 2019-10-13 ENCOUNTER — Other Ambulatory Visit: Payer: Self-pay | Admitting: Internal Medicine

## 2019-10-13 ENCOUNTER — Telehealth: Payer: Self-pay | Admitting: Internal Medicine

## 2019-10-13 DIAGNOSIS — K824 Cholesterolosis of gallbladder: Secondary | ICD-10-CM

## 2019-10-13 NOTE — Telephone Encounter (Signed)
10/13/19~Malled patient to schd [place]. Patient stated that she is not aware of test needed. Stated that she last saw Dr. Ginette Pitman in August & was not advised of test needed. She will call ref office to clarify & c/b to schd. MF.

## 2019-10-19 ENCOUNTER — Ambulatory Visit
Admission: RE | Admit: 2019-10-19 | Discharge: 2019-10-19 | Disposition: A | Discharge status: Home / Self Care | Payer: BLUE CROSS/BLUE SHIELD | Source: Ambulatory Visit | Attending: Internal Medicine | Admitting: Internal Medicine

## 2019-10-19 ENCOUNTER — Other Ambulatory Visit: Payer: Self-pay

## 2019-10-19 DIAGNOSIS — K824 Cholesterolosis of gallbladder: Secondary | ICD-10-CM | POA: Diagnosis not present

## 2020-01-04 IMAGING — MG MM DIGITAL DIAGNOSTIC BILAT W/ TOMO W/ CAD
8 of 17 series · 8 of 33 positions shown · non-contrast
Comparison: Previous exam(s).

CLINICAL DATA: Status post benign biopsy left breast mass.
Six-month follow-up a second mass in the left breast.

EXAM:
DIGITAL DIAGNOSTIC BILATERAL MAMMOGRAM WITH CAD AND TOMO
ULTRASOUND LEFT BREAST
The patient has implants. Standard and implant displaced views were
performed.

[L MLO (1 of 2)]
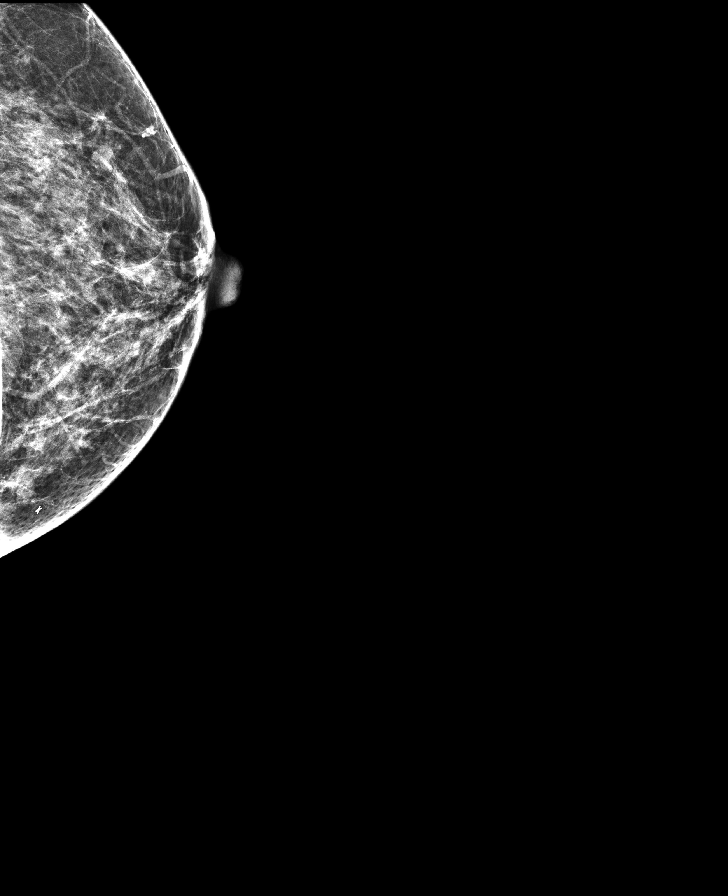

[L CC]
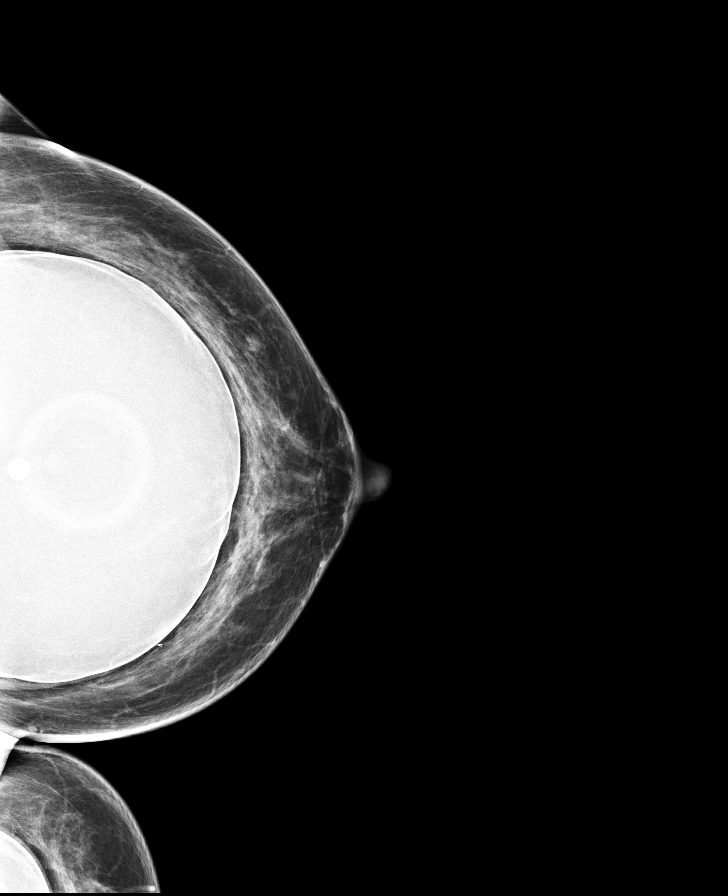

[L MLO (2 of 2)]
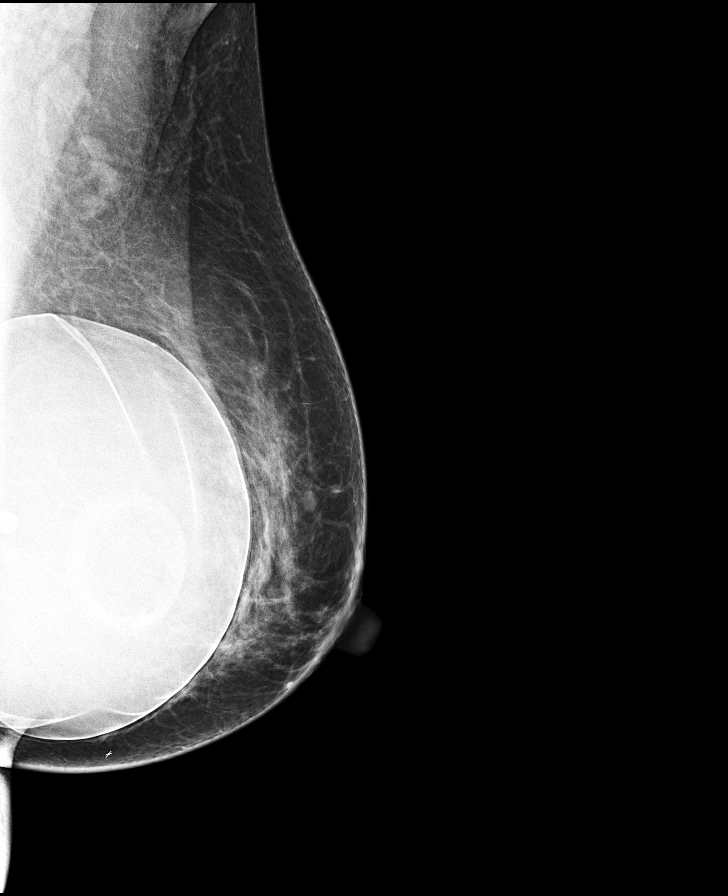

[R MLO]
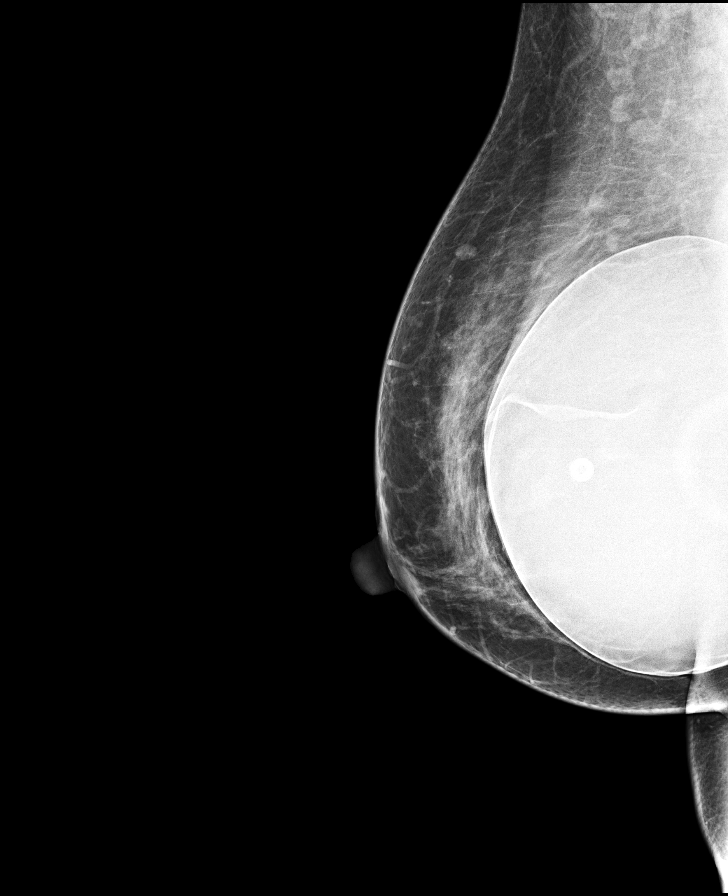

[R CC (1 of 2)]
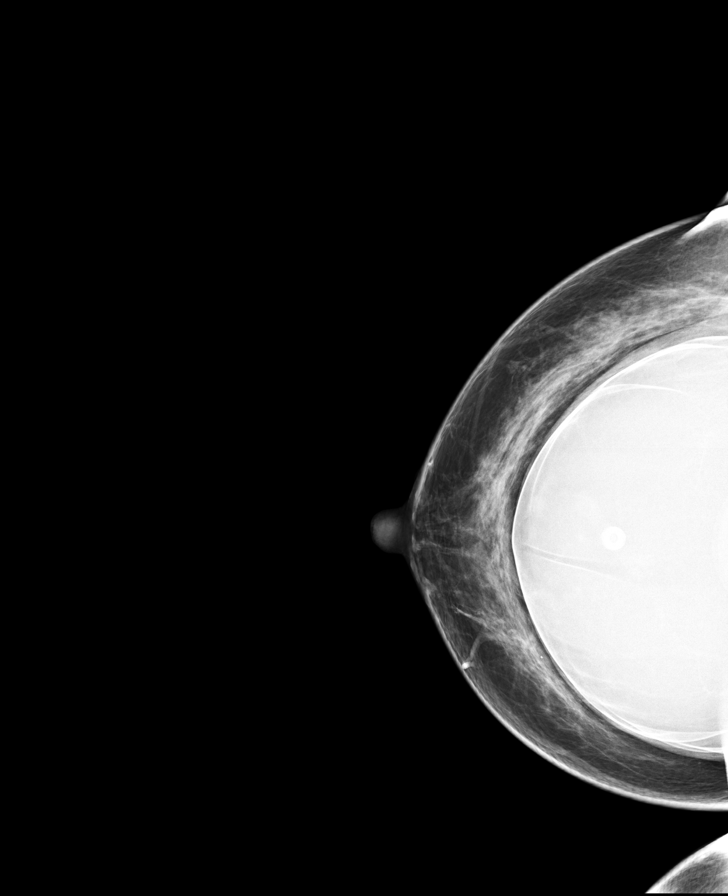

[L MLO synth-2D]
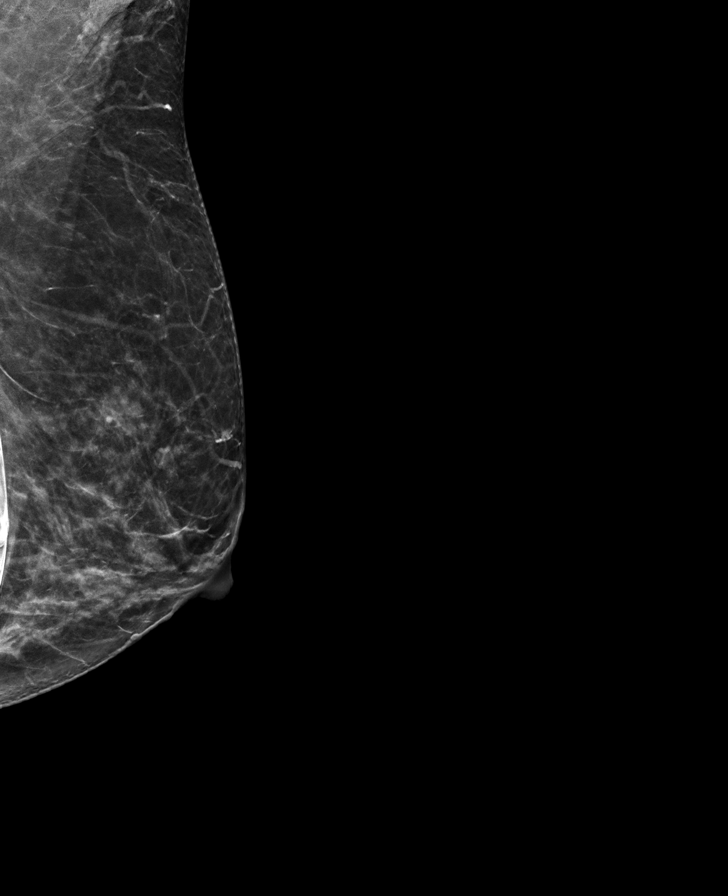

[R CC (2 of 2)]
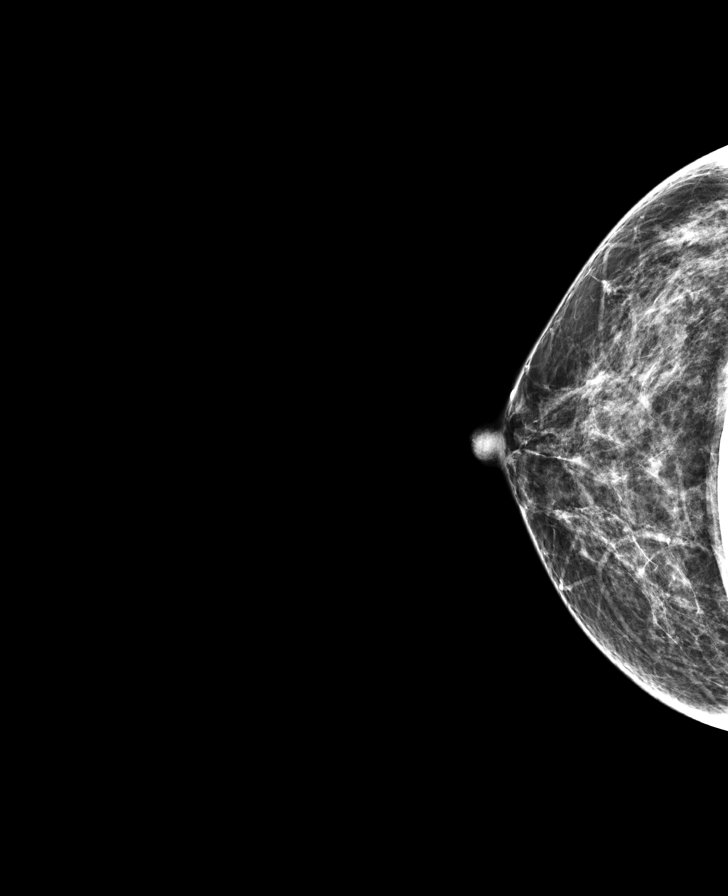

[L CC synth-2D]
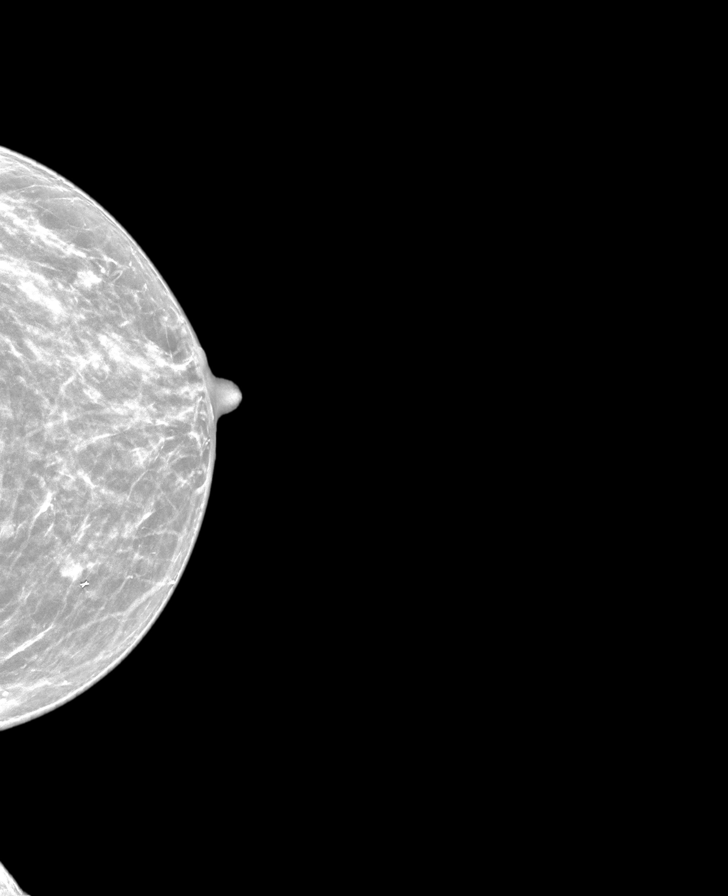

[8 of 33 positions shown; findings below may reference images not displayed]

ACR Breast Density Category c: The breast tissue is heterogeneously
dense, which may obscure small masses.
FINDINGS: Cc and MLO views of bilateral breasts, [place] cc and MLO views of
bilateral breasts are submitted. Previously noted masses within the
left breast are stable and unchanged. The right breasts is stable.

Mammographic images were processed with CAD.

Targeted ultrasound is performed, showing 0.68 cm oval hypoechoic
lesion at left breast 10 o'clock 4 cm from nipple.
IMPRESSION: Probable benign findings.

RECOMMENDATION:
Twelve month follow-up ultrasound of left breast 10 o'clock mass.

I have discussed the findings and recommendations with the patient.
Results were also provided in writing at the conclusion of the
visit. If applicable, a reminder letter will be sent to the patient
regarding the next appointment.

BI-RADS CATEGORY  3: Probably benign.

## 2020-01-08 ENCOUNTER — Other Ambulatory Visit: Payer: Self-pay | Admitting: Obstetrics & Gynecology

## 2020-01-08 DIAGNOSIS — Z1231 Encounter for screening mammogram for malignant neoplasm of breast: Secondary | ICD-10-CM

## 2020-02-05 ENCOUNTER — Ambulatory Visit
Admission: RE | Admit: 2020-02-05 | Discharge: 2020-02-05 | Disposition: A | Discharge status: Home / Self Care | Payer: BLUE CROSS/BLUE SHIELD | Source: Ambulatory Visit | Attending: Obstetrics & Gynecology | Admitting: Obstetrics & Gynecology

## 2020-02-05 ENCOUNTER — Other Ambulatory Visit: Payer: Self-pay

## 2020-02-05 DIAGNOSIS — Z1231 Encounter for screening mammogram for malignant neoplasm of breast: Secondary | ICD-10-CM | POA: Insufficient documentation

## 2020-09-16 ENCOUNTER — Other Ambulatory Visit (HOSPITAL_COMMUNITY): Payer: Self-pay | Admitting: Internal Medicine

## 2020-09-16 ENCOUNTER — Other Ambulatory Visit: Payer: Self-pay | Admitting: Internal Medicine

## 2020-09-16 DIAGNOSIS — K824 Cholesterolosis of gallbladder: Secondary | ICD-10-CM

## 2020-09-16 DIAGNOSIS — R1011 Right upper quadrant pain: Secondary | ICD-10-CM

## 2020-10-22 ENCOUNTER — Ambulatory Visit: Payer: BLUE CROSS/BLUE SHIELD

## 2021-01-10 ENCOUNTER — Other Ambulatory Visit: Payer: Self-pay | Admitting: Internal Medicine

## 2021-01-10 ENCOUNTER — Other Ambulatory Visit: Payer: Self-pay | Admitting: Obstetrics & Gynecology

## 2021-01-10 DIAGNOSIS — Z1231 Encounter for screening mammogram for malignant neoplasm of breast: Secondary | ICD-10-CM

## 2021-02-05 ENCOUNTER — Other Ambulatory Visit: Payer: Self-pay

## 2021-02-05 ENCOUNTER — Ambulatory Visit
Admission: RE | Admit: 2021-02-05 | Discharge: 2021-02-05 | Disposition: A | Discharge status: Home / Self Care | Payer: 59 | Source: Ambulatory Visit | Attending: Internal Medicine | Admitting: Internal Medicine

## 2021-02-05 DIAGNOSIS — Z1231 Encounter for screening mammogram for malignant neoplasm of breast: Secondary | ICD-10-CM | POA: Insufficient documentation

## 2021-12-25 ENCOUNTER — Other Ambulatory Visit: Payer: Self-pay | Admitting: Internal Medicine

## 2021-12-25 DIAGNOSIS — Z1231 Encounter for screening mammogram for malignant neoplasm of breast: Secondary | ICD-10-CM

## 2022-02-11 ENCOUNTER — Ambulatory Visit
Admission: RE | Admit: 2022-02-11 | Discharge: 2022-02-11 | Disposition: A | Discharge status: Home / Self Care | Payer: 59 | Source: Ambulatory Visit | Attending: Internal Medicine | Admitting: Internal Medicine

## 2022-02-11 ENCOUNTER — Other Ambulatory Visit: Payer: Self-pay

## 2022-02-11 DIAGNOSIS — Z1231 Encounter for screening mammogram for malignant neoplasm of breast: Secondary | ICD-10-CM | POA: Diagnosis not present

## 2022-03-29 ENCOUNTER — Encounter: Payer: Self-pay | Admitting: Gastroenterology

## 2022-03-29 NOTE — H&P (Signed)
? ?Pre-Procedure H&P ?  ?Patient ID: Rachel Hunter is a 51 y.o. female. ? ?Gastroenterology Provider: Annamaria Helling, DO ? ?Referring Provider: Dr. Ginette Pitman ?PCP: Tracie Harrier, MD ? ?Date: 03/30/2022 ? ?HPI ?Ms. ROSHONDA Hunter is a 51 y.o. female who presents today for Colonoscopy for Initial screening colonoscopy.  Family history of colon polyps-mother. ?Daily BM without melena hematochezia diarrhea or constipation.  No family history of colorectal cancer. ?Last reported labs hemoglobin 12.9 MCV 91.6 platelets 296,000 creatinine 0.8 ?Father history of esophageal cancer ? ?Past Medical History:  ?Diagnosis Date  ? Adjustment disorder with physical complaints 10/26/2017  ? Anemia 12/27/2017  ? 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 ? ?Past Surgical History:  ?Procedure Laterality Date  ? AUGMENTATION MAMMAPLASTY Bilateral 2007  ? BREAST BIOPSY Left 07/29/2017  ?  FIBROADENOMA  ? CESAREAN SECTION    ? DILATION AND EVACUATION N/A 12/28/2017  ? Procedure: DILATATION and curretage, hysteroscopy, balloon tamponade in uterus;  Surgeon: Ward, Honor Loh, MD;  Location: [facility] ORS;  Service: Gynecology;  Laterality: N/A;  ? ? ?Family History ?Father history of esophageal cancer ?Mother-colon polyps ?No h/o GI disease or malignancy ? ?Review of Systems  ?Constitutional:  Negative for activity change, appetite change, chills, diaphoresis, fatigue, fever and unexpected weight change.  ?HENT:  Negative for trouble swallowing and voice change.   ?Respiratory:  Negative for shortness of breath and wheezing.   ?Cardiovascular:  Negative for chest pain, palpitations and leg swelling.  ?Gastrointestinal:  Negative for abdominal distention, abdominal pain, anal bleeding, blood in stool, constipation, diarrhea, nausea, rectal pain and vomiting.  ?Musculoskeletal:  Negative for arthralgias and myalgias.  ?Skin:  Negative for color change and pallor.  ?Neurological:  Negative for dizziness, syncope and weakness.   ?Psychiatric/Behavioral:  Negative for confusion.   ?All other systems reviewed and are negative.  ? ?Medications ?No current facility-administered medications on file prior to encounter.  ? ?Current Outpatient Medications on File Prior to Encounter  ?Medication Sig Dispense Refill  ? ascorbic acid (VITAMIN C) 500 MG tablet Take 500 mg by mouth daily.    ? atorvastatin (LIPITOR) 10 MG tablet Take 10 mg by mouth daily.  1  ? Calcium Carbonate 500 MG CHEW Chew 1,000 mg by mouth daily.    ? cetirizine (ZYRTEC) 10 MG tablet Take 10 mg by mouth daily.    ? Magnesium 100 MG CAPS Take by mouth.    ? TURMERIC PO Take by mouth.    ? ferrous sulfate 325 (65 FE) MG tablet Take 1 tablet (325 mg total) by mouth 2 (two) times daily with a meal. 60 tablet 11  ? norgestimate-ethinyl estradiol (ORTHO-CYCLEN,SPRINTEC,PREVIFEM) 0.25-35 MG-MCG tablet Take 1 tablet by mouth daily. Take 1 tablet by mouth once daily. Take 3 weeks of pack and restart new pack. On the 3rd month, take the last week to have a period.    ? ? ?Pertinent medications related to GI and procedure were reviewed by me with the patient prior to the procedure ? ? ?Current Facility-Administered Medications:  ?  0.9 %  sodium chloride infusion, , Intravenous, Continuous, Annamaria Helling, DO, Last Rate: 20 mL/hr at 03/30/22 0738, New Bag at 03/30/22 0109 ?  ?  ? ?Allergies  ?Allergen Reactions  ? Niacin And Related Shortness Of Breath  ? ?Allergies were reviewed by me prior to the procedure ? ?Objective  ? ?Body mass index is 25.82 kg/m?. ?Vitals:  ? 03/30/22 0725  ?BP: 121/83  ?  Pulse: 68  ?Resp: 19  ?Temp: (!) 97.3 ?F (36.3 ?C)  ?TempSrc: Temporal  ?SpO2: 98%  ?Weight: 72.6 kg  ?Height: '5\' 6"'$  (1.676 m)  ? ? ? ?Physical Exam ?Vitals and nursing note reviewed.  ?Constitutional:   ?   General: She is not in acute distress. ?   Appearance: Normal appearance. She is not ill-appearing, toxic-appearing or diaphoretic.  ?HENT:  ?   Head: Normocephalic and atraumatic.  ?    Nose: Nose normal.  ?   Mouth/Throat:  ?   Mouth: Mucous membranes are moist.  ?   Pharynx: Oropharynx is clear.  ?Eyes:  ?   General: No scleral icterus. ?   Extraocular Movements: Extraocular movements intact.  ?Cardiovascular:  ?   Rate and Rhythm: Normal rate and regular rhythm.  ?   Heart sounds: Normal heart sounds. No murmur heard. ?  No friction rub. No gallop.  ?Pulmonary:  ?   Effort: Pulmonary effort is normal. No respiratory distress.  ?   Breath sounds: Normal breath sounds. No wheezing, rhonchi or rales.  ?Abdominal:  ?   General: Bowel sounds are normal. There is no distension.  ?   Palpations: Abdomen is soft.  ?   Tenderness: There is no abdominal tenderness. There is no guarding or rebound.  ?Musculoskeletal:  ?   Cervical back: Neck supple.  ?   Right lower leg: No edema.  ?   Left lower leg: No edema.  ?Skin: ?   General: Skin is warm and dry.  ?   Coloration: Skin is not jaundiced or pale.  ?Neurological:  ?   General: No focal deficit present.  ?   Mental Status: She is alert and oriented to person, place, and time. Mental status is at baseline.  ?Psychiatric:     ?   Mood and Affect: Mood normal.     ?   Behavior: Behavior normal.     ?   Thought Content: Thought content normal.     ?   Judgment: Judgment normal.  ? ? ? ?Assessment:  ?Ms. Rachel Hunter is a 51 y.o. female  who presents today for Colonoscopy for initial screening colonoscopy; mother colon polyps ? ?Plan:  ?Colonoscopy with possible intervention today ? ?Colonoscopy with possible biopsy, control of bleeding, polypectomy, and interventions as necessary has been discussed with the patient/patient representative. Informed consent was obtained from the patient/patient representative after explaining the indication, nature, and risks of the procedure including but not limited to death, bleeding, perforation, missed neoplasm/lesions, cardiorespiratory compromise, and reaction to medications. Opportunity for questions was given and  appropriate answers were provided. Patient/patient representative has verbalized understanding is amenable to undergoing the procedure. ? ? ?Annamaria Helling, DO  ?[facility] ? ?Portions of the record may have been created with voice recognition software. Occasional wrong-word or 'sound-a-like' substitutions may have occurred due to the inherent limitations of voice recognition software.  Read the chart carefully and recognize, using context, where substitutions may have occurred. ?

## 2022-03-30 ENCOUNTER — Ambulatory Visit: Payer: 59 | Admitting: Anesthesiology

## 2022-03-30 ENCOUNTER — Encounter
Admission: RE | Disposition: A | Discharge status: Home / Self Care | Payer: Self-pay | Source: Home / Self Care | Attending: Gastroenterology

## 2022-03-30 ENCOUNTER — Encounter: Payer: Self-pay | Admitting: Gastroenterology

## 2022-03-30 ENCOUNTER — Ambulatory Visit
Admission: RE | Admit: 2022-03-30 | Discharge: 2022-03-30 | Disposition: A | Discharge status: Home / Self Care | Payer: 59 | Attending: Gastroenterology | Admitting: Gastroenterology

## 2022-03-30 DIAGNOSIS — K621 Rectal polyp: Secondary | ICD-10-CM | POA: Insufficient documentation

## 2022-03-30 DIAGNOSIS — Z800 Family history of malignant neoplasm of digestive organs: Secondary | ICD-10-CM | POA: Diagnosis not present

## 2022-03-30 DIAGNOSIS — K5730 Diverticulosis of large intestine without perforation or abscess without bleeding: Secondary | ICD-10-CM | POA: Insufficient documentation

## 2022-03-30 DIAGNOSIS — D124 Benign neoplasm of descending colon: Secondary | ICD-10-CM | POA: Insufficient documentation

## 2022-03-30 DIAGNOSIS — Z8371 Family history of colonic polyps: Secondary | ICD-10-CM | POA: Insufficient documentation

## 2022-03-30 DIAGNOSIS — K529 Noninfective gastroenteritis and colitis, unspecified: Secondary | ICD-10-CM | POA: Insufficient documentation

## 2022-03-30 DIAGNOSIS — K640 First degree hemorrhoids: Secondary | ICD-10-CM | POA: Diagnosis not present

## 2022-03-30 DIAGNOSIS — Z1211 Encounter for screening for malignant neoplasm of colon: Secondary | ICD-10-CM | POA: Diagnosis present

## 2022-03-30 HISTORY — PX: COLONOSCOPY WITH PROPOFOL: SHX5780

## 2022-03-30 SURGERY — COLONOSCOPY WITH PROPOFOL
Anesthesia: General

## 2022-03-30 MED ORDER — PHENYLEPHRINE 40 MCG/ML (10ML) SYRINGE FOR IV PUSH (FOR BLOOD PRESSURE SUPPORT)
PREFILLED_SYRINGE | INTRAVENOUS | Status: AC
  Filled 2022-03-30: qty 10

## 2022-03-30 MED ORDER — LIDOCAINE HCL (PF) 2 % IJ SOLN
INTRAMUSCULAR | Status: AC
  Filled 2022-03-30: qty 10

## 2022-03-30 MED ORDER — SODIUM CHLORIDE 0.9 % IV SOLN: INTRAVENOUS | Status: DC

## 2022-03-30 MED ORDER — PROPOFOL 500 MG/50ML IV EMUL
INTRAVENOUS | Status: AC
  Filled 2022-03-30: qty 100

## 2022-03-30 MED ORDER — LIDOCAINE HCL (CARDIAC) PF 100 MG/5ML IV SOSY
PREFILLED_SYRINGE | INTRAVENOUS | Status: DC | PRN
  Administered 2022-03-30: 60 mg via INTRAVENOUS

## 2022-03-30 MED ORDER — PROPOFOL 500 MG/50ML IV EMUL
INTRAVENOUS | Status: DC | PRN
  Administered 2022-03-30: 140 ug/kg/min via INTRAVENOUS

## 2022-03-30 MED ORDER — PROPOFOL 500 MG/50ML IV EMUL
INTRAVENOUS | Status: AC
  Filled 2022-03-30: qty 150

## 2022-03-30 MED ORDER — PROPOFOL 10 MG/ML IV BOLUS
INTRAVENOUS | Status: DC | PRN
  Administered 2022-03-30: 70 mg via INTRAVENOUS

## 2022-03-30 NOTE — Interval H&P Note (Signed)
History and Physical Interval Note: Preprocedure H&P from 03/30/22 ? was reviewed and there was no interval change after seeing and examining the patient.  Written consent was obtained from the patient after discussion of risks, benefits, and alternatives. Patient has consented to proceed with Colonoscopy with possible intervention ? ? ?03/30/2022 ?7:42 AM ? ?[name]  has presented today for surgery, with the diagnosis of family history colon polyps.  The various methods of treatment have been discussed with the patient and family. After consideration of risks, benefits and other options for treatment, the patient has consented to  Procedure(s): ?COLONOSCOPY WITH PROPOFOL (N/A) as a surgical intervention.  The patient's history has been reviewed, patient examined, no change in status, stable for surgery.  I have reviewed the patient's chart and labs.  Questions were answered to the patient's satisfaction.   ? ? ?Rachel Hunter ? ? ?

## 2022-03-30 NOTE — Transfer of Care (Signed)
Immediate Anesthesia Transfer of Care Note ? ?Patient: Rachel Hunter ? ?Procedure(s) Performed: COLONOSCOPY WITH PROPOFOL ? ?Patient Location: PACU ? ?Anesthesia Type:General ? ?Level of Consciousness: awake, alert  and oriented ? ?Airway & Oxygen Therapy: Patient Spontanous Breathing ? ?Post-op Assessment: Report given to RN and Post -op Vital signs reviewed and stable ? ?Post vital signs: Reviewed and stable ? ?Last Vitals:  ?Vitals Value Taken Time  ?BP    ?Temp    ?Pulse    ?Resp    ?SpO2    ? ? ?Last Pain:  ?Vitals:  ? 03/30/22 0725  ?TempSrc: Temporal  ?PainSc: 0-No pain  ?   ? ?  ? ?Complications: No notable events documented. ?

## 2022-03-30 NOTE — Anesthesia Preprocedure Evaluation (Signed)
Anesthesia Evaluation  ?Patient identified by MRN, date of birth, ID band ?Patient awake ? ? ? ?Reviewed: ?Allergy & Precautions, NPO status , Patient's Chart, lab work & pertinent test results ? ?Airway ?Mallampati: III ? ?TM Distance: >3 FB ?Neck ROM: full ? ? ? Dental ? ?(+) Chipped ?  ?Pulmonary ?neg pulmonary ROS, neg shortness of breath,  ?  ?Pulmonary exam normal ? ? ? ? ? ? ? Cardiovascular ?Exercise Tolerance: Good ?(-) anginanegative cardio ROS ?Normal cardiovascular exam ? ? ?  ?Neuro/Psych ?PSYCHIATRIC DISORDERS negative neurological ROS ? negative psych ROS  ? GI/Hepatic ?negative GI ROS, Neg liver ROS, neg GERD  ,  ?Endo/Other  ?negative endocrine ROS ? Renal/GU ?negative Renal ROS  ?negative genitourinary ?  ?Musculoskeletal ? ? Abdominal ?  ?Peds ? 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 ?Past Surgical History: ?2007: AUGMENTATION MAMMAPLASTY; Bilateral ?07/29/2017: BREAST BIOPSY; Left ?    Comment:   FIBROADENOMA ?No date: CESAREAN SECTION ?12/28/2017: DILATION AND EVACUATION; N/A ?    Comment:  Procedure: DILATATION and curretage, hysteroscopy,  ?             balloon tamponade in uterus;  Surgeon: Ward, Honor Loh,  ?             MD;  Location: [facility] ORS;  Service: Gynecology;   ?             Laterality: N/A; ? ?BMI   ? Body Mass Index: 25.82 kg/m?  ?  ? ? Reproductive/Obstetrics ?negative OB ROS ? ?  ? ? ? ? ? ? ? ? ? ? ? ? ? ?  ?  ? ? ? ? ? ? ? ? ?Anesthesia Physical ?Anesthesia Plan ? ?ASA: 2 ? ?Anesthesia Plan: General  ? ?Post-op Pain Management:   ? ?Induction: Intravenous ? ?PONV Risk Score and Plan: Propofol infusion and TIVA ? ?Airway Management Planned: Natural Airway and Nasal Cannula ? ?Additional Equipment:  ? ?Intra-op Plan:  ? ?Post-operative Plan:  ? ?Informed Consent: I have  reviewed the patients History and Physical, chart, labs and discussed the procedure including the risks, benefits and alternatives for the proposed anesthesia with the patient or authorized representative who has indicated his/her understanding and acceptance.  ? ? ? ?Dental Advisory Given ? ?Plan Discussed with: Anesthesiologist, CRNA and Surgeon ? ?Anesthesia Plan Comments: (Patient consented for risks of anesthesia including but not limited to:  ?- adverse reactions to medications ?- risk of airway placement if required ?- damage to eyes, teeth, lips or other oral mucosa ?- nerve damage due to positioning  ?- sore throat or hoarseness ?- Damage to heart, brain, nerves, lungs, other parts of body or loss of life ? ?Patient voiced understanding.)  ? ? ? ? ? ? ?Anesthesia Quick Evaluation ? ?

## 2022-03-30 NOTE — Op Note (Signed)
[facility] ?Gastroenterology ?Patient Name: Rachel Hunter ?Procedure Date: 03/30/2022 7:47 AM ?MRN: [id number] ?Account #: [id number] ?Date of Birth: March 03, 1971 ?Admit Type: Outpatient ?Age: 51 ?Room: [facility] ENDO ROOM 2 ?Gender: Female ?Note Status: Finalized ?Instrument Name: Colonoscope [id number] ?Procedure:             Colonoscopy ?Indications:           Screening for colorectal malignant neoplasm, Colon  ?                       cancer screening in patient at increased risk: Family  ?                       history of 1st-degree relative with colon polyps ?Providers:             Annamaria Helling DO, DO ?Referring MD:          Tracie Harrier, MD (Referring MD) ?Medicines:             Monitored Anesthesia Care ?Complications:         No immediate complications. Estimated blood loss:  ?                       Minimal. ?Procedure:             Pre-Anesthesia Assessment: ?                       - Prior to the procedure, a History and Physical was  ?                       performed, and patient medications and allergies were  ?                       reviewed. The patient is competent. The risks and  ?                       benefits of the procedure and the sedation options and  ?                       risks were discussed with the patient. All questions  ?                       were answered and informed consent was obtained.  ?                       Patient identification and proposed procedure were  ?                       verified by the physician, the nurse, the anesthetist  ?                       and the technician in the endoscopy suite. Mental  ?                       Status Examination: alert and oriented. Airway  ?                       Examination: normal oropharyngeal airway and neck  ?  mobility. Respiratory Examination: clear to  ?                       auscultation. CV Examination: RRR, no murmurs, no S3  ?                       or S4. Prophylactic Antibiotics: The  patient does not  ?                       require prophylactic antibiotics. Prior  ?                       Anticoagulants: The patient has taken no previous  ?                       anticoagulant or antiplatelet agents. ASA Grade  ?                       Assessment: II - A patient with mild systemic disease.  ?                       After reviewing the risks and benefits, the patient  ?                       was deemed in satisfactory condition to undergo the  ?                       procedure. The anesthesia plan was to use monitored  ?                       anesthesia care (MAC). Immediately prior to  ?                       administration of medications, the patient was  ?                       re-assessed for adequacy to receive sedatives. The  ?                       heart rate, respiratory rate, oxygen saturations,  ?                       blood pressure, adequacy of pulmonary ventilation, and  ?                       response to care were monitored throughout the  ?                       procedure. The physical status of the patient was  ?                       re-assessed after the procedure. ?                       After obtaining informed consent, the colonoscope was  ?                       passed under direct vision. Throughout the procedure,  ?  the patient's blood pressure, pulse, and oxygen  ?                       saturations were monitored continuously. The  ?                       Colonoscope was introduced through the anus and  ?                       advanced to the the terminal ileum, with  ?                       identification of the appendiceal orifice and IC  ?                       valve. The colonoscopy was performed without  ?                       difficulty. The patient tolerated the procedure well.  ?                       The quality of the bowel preparation was evaluated  ?                       using the BBPS [facility] Bowel Preparation Scale) with  ?                        scores of: Right Colon = 3, Transverse Colon = 3 and  ?                       Left Colon = 3 (entire mucosa seen well with no  ?                       residual staining, small fragments of stool or opaque  ?                       liquid). The total BBPS score equals 9. The terminal  ?                       ileum, ileocecal valve, appendiceal orifice, and  ?                       rectum were photographed. ?Findings: ?     The perianal and digital rectal examinations were normal. Pertinent  ?     negatives include normal sphincter tone. ?     Localized inflammation, mild in severity and characterized by erosions  ?     was found in the ileocecal valve. Biopsies were taken with a cold  ?     forceps for histology. Estimated blood loss was minimal. ?     A few small-mouthed diverticula were found in the entire colon.  ?     Estimated blood loss: none. ?     A 4 to 5 mm polyp was found in the descending colon. The polyp was  ?     sessile. The polyp was removed with a cold snare. Resection and  ?     retrieval were complete. Estimated blood loss was minimal. ?     Two  sessile polyps were found in the rectum. The polyps were 1 to 2 mm  ?     in size. These polyps were removed with a cold biopsy forceps. Resection  ?     and retrieval were complete. Estimated blood loss was minimal. ?     Non-bleeding internal hemorrhoids were found during retroflexion. The  ?     hemorrhoids were Grade I (internal hemorrhoids that do not prolapse).  ?     Estimated blood loss: none. ?     The exam was otherwise without abnormality on direct and retroflexion  ?     views. ?Impression:            - Ileitis. Biopsied. ?                       - Diverticulosis in the entire examined colon. ?                       - One 4 to 5 mm polyp in the descending colon, removed  ?                       with a cold snare. Resected and retrieved. ?                       - Two 1 to 2 mm polyps in the rectum, removed with a  ?                        cold biopsy forceps. Resected and retrieved. ?                       - Non-bleeding internal hemorrhoids. ?                       - The examination was otherwise normal on direct and  ?                       retroflexion views. ?Recommendation:        - Discharge patient to home. ?                       - Resume previous diet. ?                       - Continue present medications. ?                       - No aspirin, ibuprofen, naproxen, or other  ?                       non-steroidal anti-inflammatory drugs for 5 days after  ?                       polyp removal. ?                       - Await pathology results. ?                       - Repeat colonoscopy for surveillance based on  ?  pathology results. ?                       - Return to referring physician as previously  ?                       scheduled. ?                       - The findings and recommendations were discussed with  ?                       the patient. ?Procedure Code(s):     --- Professional --- ?                       [phone], Colonoscopy, flexible; with removal of  ?                       tumor(s), polyp(s), or other lesion(s) by snare  ?                       technique ?                       [id number], 59, Colonoscopy, flexible; with biopsy, single  ?                       or multiple ?Diagnosis Code(s):     --- Professional --- ?                       Z12.11, Encounter for screening for malignant neoplasm  ?                       of colon ?                       Z83.71, Family history of colonic polyps ?                       K52.9, Noninfective gastroenteritis and colitis,  ?                       unspecified ?                       K64.0, First degree hemorrhoids ?                       K63.5, Polyp of colon ?                       K62.1, Rectal polyp ?                       K57.30, Diverticulosis of large intestine without  ?                       perforation or abscess without bleeding ?CPT copyright 2019 American  Medical Association. All rights reserved. ?The codes documented in this report are preliminary and upon coder review may  ?be revised to meet current compliance requirements. ?Attending Participation: ?

## 2022-03-30 NOTE — Anesthesia Postprocedure Evaluation (Signed)
Anesthesia Post Note ? ?Patient: DOMONIC HISCOX ? ?Procedure(s) Performed: COLONOSCOPY WITH PROPOFOL ? ?Patient location during evaluation: Endoscopy ?Anesthesia Type: General ?Level of consciousness: awake and alert ?Pain management: pain level controlled ?Vital Signs Assessment: post-procedure vital signs reviewed and stable ?Respiratory status: spontaneous breathing, nonlabored ventilation, respiratory function stable and patient connected to nasal cannula oxygen ?Cardiovascular status: blood pressure returned to baseline and stable ?Postop Assessment: no apparent nausea or vomiting ?Anesthetic complications: no ? ? ?No notable events documented. ? ? ?Last Vitals:  ?Vitals:  ? 03/30/22 0847 03/30/22 0857  ?BP: (!) 120/91 135/82  ?Pulse: (!) 55 (!) 51  ?Resp: 18 15  ?Temp:    ?SpO2: 98% 99%  ?  ?Last Pain:  ?Vitals:  ? 03/30/22 0857  ?TempSrc:   ?PainSc: 0-No pain  ? ? ?  ?  ?  ?  ?  ?  ? ?Precious Haws Zahava Quant ? ? ? ? ?

## 2022-03-31 ENCOUNTER — Encounter: Payer: Self-pay | Admitting: Gastroenterology

## 2022-03-31 LAB — SURGICAL PATHOLOGY

## 2022-12-31 ENCOUNTER — Other Ambulatory Visit: Payer: Self-pay | Admitting: Internal Medicine

## 2022-12-31 DIAGNOSIS — Z1231 Encounter for screening mammogram for malignant neoplasm of breast: Secondary | ICD-10-CM

## 2023-02-16 ENCOUNTER — Ambulatory Visit
Admission: RE | Admit: 2023-02-16 | Discharge: 2023-02-16 | Disposition: A | Discharge status: Home / Self Care | Payer: 59 | Source: Ambulatory Visit | Attending: Internal Medicine | Admitting: Internal Medicine

## 2023-02-16 DIAGNOSIS — Z1231 Encounter for screening mammogram for malignant neoplasm of breast: Secondary | ICD-10-CM | POA: Diagnosis not present

## 2024-01-05 ENCOUNTER — Other Ambulatory Visit: Payer: Self-pay | Admitting: Internal Medicine

## 2024-01-05 DIAGNOSIS — Z1231 Encounter for screening mammogram for malignant neoplasm of breast: Secondary | ICD-10-CM

## 2024-02-17 ENCOUNTER — Ambulatory Visit
Admission: RE | Admit: 2024-02-17 | Discharge: 2024-02-17 | Disposition: A | Discharge status: Home / Self Care | Payer: 59 | Source: Ambulatory Visit | Attending: Internal Medicine | Admitting: Internal Medicine

## 2024-02-17 DIAGNOSIS — Z1231 Encounter for screening mammogram for malignant neoplasm of breast: Secondary | ICD-10-CM | POA: Insufficient documentation

## 2024-02-24 ENCOUNTER — Other Ambulatory Visit: Payer: Self-pay | Admitting: Internal Medicine

## 2024-02-24 DIAGNOSIS — R928 Other abnormal and inconclusive findings on diagnostic imaging of breast: Secondary | ICD-10-CM

## 2024-03-01 ENCOUNTER — Ambulatory Visit
Admission: RE | Admit: 2024-03-01 | Discharge: 2024-03-01 | Disposition: A | Discharge status: Home / Self Care | Source: Ambulatory Visit | Attending: Internal Medicine | Admitting: Internal Medicine

## 2024-03-01 DIAGNOSIS — R928 Other abnormal and inconclusive findings on diagnostic imaging of breast: Secondary | ICD-10-CM | POA: Diagnosis present
